# Patient Record
Sex: Female | Born: 1965
Health system: Southern US, Community
[De-identification: ages and names within clinical notes are randomized; demographics above are authoritative.]

## PROBLEM LIST (undated history)

## (undated) DIAGNOSIS — J45909 Unspecified asthma, uncomplicated: Secondary | ICD-10-CM

## (undated) DIAGNOSIS — E785 Hyperlipidemia, unspecified: Secondary | ICD-10-CM

## (undated) DIAGNOSIS — G4730 Sleep apnea, unspecified: Secondary | ICD-10-CM

## (undated) DIAGNOSIS — D649 Anemia, unspecified: Secondary | ICD-10-CM

## (undated) DIAGNOSIS — I10 Essential (primary) hypertension: Secondary | ICD-10-CM

## (undated) HISTORY — DX: Essential (primary) hypertension: I10

## (undated) HISTORY — DX: Unspecified asthma, uncomplicated: J45.909

## (undated) HISTORY — PX: WISDOM TOOTH EXTRACTION: SHX21

---

## 1997-10-17 ENCOUNTER — Encounter: Admission: RE | Admit: 1997-10-17 | Discharge: 1997-10-17 | Payer: Self-pay | Admitting: Family Medicine

## 1997-11-01 ENCOUNTER — Encounter: Admission: RE | Admit: 1997-11-01 | Discharge: 1997-11-01 | Payer: Self-pay | Admitting: Sports Medicine

## 1998-01-19 ENCOUNTER — Encounter: Admission: RE | Admit: 1998-01-19 | Discharge: 1998-01-19 | Payer: Self-pay | Admitting: Family Medicine

## 1998-08-08 ENCOUNTER — Encounter: Admission: RE | Admit: 1998-08-08 | Discharge: 1998-08-08 | Payer: Self-pay | Admitting: Family Medicine

## 1998-10-25 ENCOUNTER — Encounter: Admission: RE | Admit: 1998-10-25 | Discharge: 1998-10-25 | Payer: Self-pay | Admitting: Sports Medicine

## 1998-11-01 ENCOUNTER — Encounter: Admission: RE | Admit: 1998-11-01 | Discharge: 1998-11-01 | Payer: Self-pay | Admitting: *Deleted

## 1998-11-15 ENCOUNTER — Encounter: Admission: RE | Admit: 1998-11-15 | Discharge: 1998-11-15 | Payer: Self-pay | Admitting: Family Medicine

## 1998-12-13 ENCOUNTER — Encounter: Admission: RE | Admit: 1998-12-13 | Discharge: 1998-12-13 | Payer: Self-pay | Admitting: Family Medicine

## 1999-02-02 ENCOUNTER — Encounter: Admission: RE | Admit: 1999-02-02 | Discharge: 1999-02-02 | Payer: Self-pay | Admitting: Family Medicine

## 1999-03-09 ENCOUNTER — Encounter: Admission: RE | Admit: 1999-03-09 | Discharge: 1999-03-09 | Payer: Self-pay | Admitting: Family Medicine

## 1999-06-11 ENCOUNTER — Encounter: Admission: RE | Admit: 1999-06-11 | Discharge: 1999-06-11 | Payer: Self-pay | Admitting: Family Medicine

## 1999-11-29 ENCOUNTER — Encounter: Admission: RE | Admit: 1999-11-29 | Discharge: 1999-11-29 | Payer: Self-pay | Admitting: Family Medicine

## 2000-01-01 ENCOUNTER — Encounter: Admission: RE | Admit: 2000-01-01 | Discharge: 2000-01-01 | Payer: Self-pay | Admitting: Sports Medicine

## 2000-02-08 ENCOUNTER — Encounter: Admission: RE | Admit: 2000-02-08 | Discharge: 2000-02-08 | Payer: Self-pay | Admitting: Family Medicine

## 2000-03-12 ENCOUNTER — Encounter: Admission: RE | Admit: 2000-03-12 | Discharge: 2000-03-12 | Payer: Self-pay | Admitting: Family Medicine

## 2000-05-08 ENCOUNTER — Encounter: Admission: RE | Admit: 2000-05-08 | Discharge: 2000-05-08 | Payer: Self-pay | Admitting: Family Medicine

## 2000-06-23 ENCOUNTER — Encounter: Admission: RE | Admit: 2000-06-23 | Discharge: 2000-06-23 | Payer: Self-pay | Admitting: Family Medicine

## 2000-06-27 ENCOUNTER — Encounter: Admission: RE | Admit: 2000-06-27 | Discharge: 2000-06-27 | Payer: Self-pay | Admitting: Family Medicine

## 2000-10-15 ENCOUNTER — Encounter: Admission: RE | Admit: 2000-10-15 | Discharge: 2000-10-15 | Payer: Self-pay | Admitting: Family Medicine

## 2001-05-07 ENCOUNTER — Encounter: Admission: RE | Admit: 2001-05-07 | Discharge: 2001-05-07 | Payer: Self-pay | Admitting: Sports Medicine

## 2001-12-22 ENCOUNTER — Encounter: Admission: RE | Admit: 2001-12-22 | Discharge: 2001-12-22 | Payer: Self-pay | Admitting: Family Medicine

## 2002-01-22 ENCOUNTER — Encounter: Admission: RE | Admit: 2002-01-22 | Discharge: 2002-01-22 | Payer: Self-pay | Admitting: Family Medicine

## 2002-06-24 ENCOUNTER — Encounter: Admission: RE | Admit: 2002-06-24 | Discharge: 2002-06-24 | Payer: Self-pay | Admitting: Family Medicine

## 2002-07-06 ENCOUNTER — Encounter: Payer: Self-pay | Admitting: Sports Medicine

## 2002-07-06 ENCOUNTER — Encounter: Admission: RE | Admit: 2002-07-06 | Discharge: 2002-07-06 | Payer: Self-pay | Admitting: Sports Medicine

## 2002-07-13 ENCOUNTER — Encounter: Admission: RE | Admit: 2002-07-13 | Discharge: 2002-07-13 | Payer: Self-pay | Admitting: Sports Medicine

## 2002-07-22 ENCOUNTER — Encounter: Payer: Self-pay | Admitting: *Deleted

## 2002-07-22 ENCOUNTER — Encounter: Admission: RE | Admit: 2002-07-22 | Discharge: 2002-07-22 | Payer: Self-pay | Admitting: Sports Medicine

## 2002-07-22 LAB — CONVERTED CEMR LAB
Ferritin: 31 ng/mL
Iron: 46 ug/dL
TIBC: 294 ug/dL
Vitamin B-12: 1045 pg/mL

## 2002-09-08 ENCOUNTER — Encounter: Admission: RE | Admit: 2002-09-08 | Discharge: 2002-09-08 | Payer: Self-pay | Admitting: Family Medicine

## 2002-09-13 ENCOUNTER — Encounter: Admission: RE | Admit: 2002-09-13 | Discharge: 2002-09-13 | Payer: Self-pay | Admitting: Sports Medicine

## 2002-12-21 ENCOUNTER — Encounter: Admission: RE | Admit: 2002-12-21 | Discharge: 2002-12-21 | Payer: Self-pay | Admitting: Sports Medicine

## 2002-12-28 ENCOUNTER — Encounter: Admission: RE | Admit: 2002-12-28 | Discharge: 2002-12-28 | Payer: Self-pay | Admitting: Sports Medicine

## 2003-01-07 ENCOUNTER — Encounter: Admission: RE | Admit: 2003-01-07 | Discharge: 2003-01-07 | Payer: Self-pay | Admitting: Family Medicine

## 2003-01-28 ENCOUNTER — Encounter: Admission: RE | Admit: 2003-01-28 | Discharge: 2003-01-28 | Payer: Self-pay | Admitting: Family Medicine

## 2003-02-28 ENCOUNTER — Encounter: Admission: RE | Admit: 2003-02-28 | Discharge: 2003-02-28 | Payer: Self-pay | Admitting: Sports Medicine

## 2003-04-19 ENCOUNTER — Encounter: Admission: RE | Admit: 2003-04-19 | Discharge: 2003-04-19 | Payer: Self-pay | Admitting: Family Medicine

## 2003-04-30 ENCOUNTER — Ambulatory Visit (HOSPITAL_BASED_OUTPATIENT_CLINIC_OR_DEPARTMENT_OTHER): Admission: RE | Admit: 2003-04-30 | Discharge: 2003-04-30 | Payer: Self-pay

## 2003-05-23 ENCOUNTER — Encounter: Admission: RE | Admit: 2003-05-23 | Discharge: 2003-05-23 | Payer: Self-pay | Admitting: Family Medicine

## 2003-08-01 ENCOUNTER — Encounter: Admission: RE | Admit: 2003-08-01 | Discharge: 2003-08-01 | Payer: Self-pay | Admitting: Sports Medicine

## 2004-05-07 ENCOUNTER — Ambulatory Visit: Payer: Self-pay | Admitting: Family Medicine

## 2004-05-16 ENCOUNTER — Ambulatory Visit: Payer: Self-pay | Admitting: Family Medicine

## 2004-07-19 ENCOUNTER — Encounter (INDEPENDENT_AMBULATORY_CARE_PROVIDER_SITE_OTHER): Payer: Self-pay | Admitting: *Deleted

## 2004-08-06 ENCOUNTER — Encounter: Payer: Self-pay | Admitting: *Deleted

## 2004-08-06 ENCOUNTER — Ambulatory Visit: Payer: Self-pay | Admitting: Family Medicine

## 2004-08-06 LAB — CONVERTED CEMR LAB
HCT: 36.1 %
Hemoglobin: 12 g/dL
MCV: 93.3 fL
Platelets: 289 10*3/uL

## 2004-09-26 ENCOUNTER — Ambulatory Visit: Payer: Self-pay | Admitting: Family Medicine

## 2004-11-20 ENCOUNTER — Ambulatory Visit: Payer: Self-pay | Admitting: Sports Medicine

## 2005-02-04 ENCOUNTER — Ambulatory Visit: Payer: Self-pay | Admitting: Family Medicine

## 2005-03-04 ENCOUNTER — Encounter: Admission: RE | Admit: 2005-03-04 | Discharge: 2005-03-04 | Payer: Self-pay | Admitting: Sports Medicine

## 2005-10-23 ENCOUNTER — Encounter: Payer: Self-pay | Admitting: *Deleted

## 2005-10-23 ENCOUNTER — Ambulatory Visit: Payer: Self-pay | Admitting: Family Medicine

## 2005-10-23 LAB — CONVERTED CEMR LAB
HCT: 34.3 %
WBC: 5 10*3/uL

## 2005-12-05 ENCOUNTER — Ambulatory Visit: Payer: Self-pay | Admitting: Family Medicine

## 2005-12-25 ENCOUNTER — Ambulatory Visit: Payer: Self-pay | Admitting: Family Medicine

## 2006-03-04 ENCOUNTER — Encounter: Admission: RE | Admit: 2006-03-04 | Discharge: 2006-03-04 | Payer: Self-pay | Admitting: Sports Medicine

## 2006-03-04 ENCOUNTER — Ambulatory Visit: Payer: Self-pay | Admitting: Family Medicine

## 2006-04-17 DIAGNOSIS — E785 Hyperlipidemia, unspecified: Secondary | ICD-10-CM

## 2006-04-17 DIAGNOSIS — J309 Allergic rhinitis, unspecified: Secondary | ICD-10-CM | POA: Insufficient documentation

## 2006-04-17 DIAGNOSIS — G4730 Sleep apnea, unspecified: Secondary | ICD-10-CM | POA: Insufficient documentation

## 2006-04-17 DIAGNOSIS — L659 Nonscarring hair loss, unspecified: Secondary | ICD-10-CM | POA: Insufficient documentation

## 2006-04-17 DIAGNOSIS — J45909 Unspecified asthma, uncomplicated: Secondary | ICD-10-CM | POA: Insufficient documentation

## 2006-04-18 ENCOUNTER — Encounter (INDEPENDENT_AMBULATORY_CARE_PROVIDER_SITE_OTHER): Payer: Self-pay | Admitting: *Deleted

## 2007-01-06 ENCOUNTER — Encounter: Payer: Self-pay | Admitting: Family Medicine

## 2009-05-30 ENCOUNTER — Ambulatory Visit: Payer: Self-pay | Admitting: Family Medicine

## 2009-05-30 ENCOUNTER — Encounter: Payer: Self-pay | Admitting: Family Medicine

## 2009-05-30 DIAGNOSIS — R0989 Other specified symptoms and signs involving the circulatory and respiratory systems: Secondary | ICD-10-CM

## 2009-05-30 DIAGNOSIS — N920 Excessive and frequent menstruation with regular cycle: Secondary | ICD-10-CM

## 2009-05-30 DIAGNOSIS — R030 Elevated blood-pressure reading, without diagnosis of hypertension: Secondary | ICD-10-CM

## 2009-05-30 DIAGNOSIS — M25569 Pain in unspecified knee: Secondary | ICD-10-CM

## 2009-05-30 DIAGNOSIS — E669 Obesity, unspecified: Secondary | ICD-10-CM | POA: Insufficient documentation

## 2009-05-30 DIAGNOSIS — R0609 Other forms of dyspnea: Secondary | ICD-10-CM

## 2009-05-30 LAB — CONVERTED CEMR LAB
AST: 18 units/L (ref 0–37)
Albumin: 4.2 g/dL (ref 3.5–5.2)
Alkaline Phosphatase: 52 units/L (ref 39–117)
Calcium: 9.3 mg/dL (ref 8.4–10.5)
HCT: 30.8 % — ABNORMAL LOW (ref 36.0–46.0)
Hemoglobin: 9.6 g/dL — ABNORMAL LOW (ref 12.0–15.0)
MCHC: 31.2 g/dL (ref 30.0–36.0)
MCV: 87.7 fL (ref 78.0–100.0)
Platelets: 311 10*3/uL (ref 150–400)
RDW: 14.1 % (ref 11.5–15.5)
Sodium: 137 meq/L (ref 135–145)
Total Protein: 7.1 g/dL (ref 6.0–8.3)
Triglycerides: 74 mg/dL (ref <150)

## 2009-05-31 ENCOUNTER — Encounter: Payer: Self-pay | Admitting: Family Medicine

## 2009-05-31 DIAGNOSIS — D649 Anemia, unspecified: Secondary | ICD-10-CM

## 2009-06-02 LAB — CONVERTED CEMR LAB
Ferritin: 9 ng/mL — ABNORMAL LOW (ref 10–291)
Saturation Ratios: 43 % (ref 20–55)
TIBC: 431 ug/dL (ref 250–470)
Vitamin B-12: 903 pg/mL (ref 211–911)

## 2009-06-13 ENCOUNTER — Telehealth: Payer: Self-pay | Admitting: *Deleted

## 2009-07-25 ENCOUNTER — Ambulatory Visit: Payer: Self-pay | Admitting: Family Medicine

## 2009-07-25 ENCOUNTER — Encounter: Payer: Self-pay | Admitting: Family Medicine

## 2009-07-26 LAB — CONVERTED CEMR LAB
HCT: 33 % — ABNORMAL LOW (ref 36.0–46.0)
Hemoglobin: 10.6 g/dL — ABNORMAL LOW (ref 12.0–15.0)
MCHC: 32.1 g/dL (ref 30.0–36.0)
MCV: 90.7 fL (ref 78.0–100.0)
Platelets: 260 10*3/uL (ref 150–400)
Retic Ct Pct: 1.7 % (ref 0.4–3.1)
WBC: 5.2 10*3/uL (ref 4.0–10.5)

## 2009-08-17 ENCOUNTER — Telehealth: Payer: Self-pay | Admitting: *Deleted

## 2009-11-07 ENCOUNTER — Ambulatory Visit: Payer: Self-pay | Admitting: Family Medicine

## 2009-11-07 ENCOUNTER — Encounter: Payer: Self-pay | Admitting: Family Medicine

## 2009-11-07 DIAGNOSIS — N898 Other specified noninflammatory disorders of vagina: Secondary | ICD-10-CM | POA: Insufficient documentation

## 2009-11-07 DIAGNOSIS — R198 Other specified symptoms and signs involving the digestive system and abdomen: Secondary | ICD-10-CM | POA: Insufficient documentation

## 2009-11-07 LAB — CONVERTED CEMR LAB: Pap Smear: NEGATIVE

## 2009-11-11 ENCOUNTER — Encounter: Payer: Self-pay | Admitting: Family Medicine

## 2009-11-11 LAB — CONVERTED CEMR LAB
GC Probe Amp, Genital: NEGATIVE
HCT: 30.1 % — ABNORMAL LOW (ref 36.0–46.0)
MCHC: 32.9 g/dL (ref 30.0–36.0)
MCV: 93.5 fL (ref 78.0–100.0)
WBC: 4.3 10*3/uL (ref 4.0–10.5)

## 2010-03-21 NOTE — Assessment & Plan Note (Signed)
Summary: allergies,df   Vital Signs:  Patient profile:   45 year old female Height:      63 inches Weight:      220.6 pounds BMI:     39.22 Temp:     98.6 degrees F oral Pulse rate:   83 / minute BP sitting:   141 / 92  (left arm) Cuff size:   regular  Vitals Entered By: Garen Grams LPN (July 25, 1608 11:07 AM) CC: f/u allergies/asthma, f/u BP, anemia Is Patient Diabetic? No Pain Assessment Patient in pain? no        CC:  f/u allergies/asthma, f/u BP, and anemia.  History of Present Illness: allergies/asthma: has been taking zyrtec but doesn't think this is helping.  finds herself feeling like she is wheezing and coughing partiucarly at night but also during the day.  it has worsened as the summer has come on.  she never got the nose spray for her allergies due to cost.  she thinks she needs an inhaler.  she denies fevers.   blood pressure: has noted that it has been up also outside of clinic though didn't bring numbers to clinic.  has noticed some headaches but unsure if related to blood pressure.  denies dizziness, denies weakness, denies swelling in her legs.  isn't using her cpap machine and hasn't in >61yr.   anemia: has been taking her iron pills without problems.  denies constipation  Habits & Providers  Alcohol-Tobacco-Diet     Tobacco Status: never  Current Medications (verified): 1)  Daily Vitamins For Women  Tabs (Multiple Vitamins-Calcium) .... One Daily 2)  Naproxen 500 Mg Tabs (Naproxen) .Marland Kitchen.. 1 By Mouth Two Times A Day As Needed Knee Pain 3)  Fluticasone Propionate 50 Mcg/act Susp (Fluticasone Propionate) .... 2 Sprays Each Nostril Two Times A Day For Congestion 4)  Ferrous Sulfate 325 (65 Fe) Mg Tabs (Ferrous Sulfate) .... Once Daily Otc Supplement 5)  Proventil Hfa 108 (90 Base) Mcg/act Aers (Albuterol Sulfate) .... 2 Puffs Q4hr As Needed Wheezing/shortness of Breath 6)  Symbicort 80-4.5 Mcg/act Aero (Budesonide-Formoterol Fumarate) .... 2 Puffs Inhaled Two  Times A Day For Asthma Maintenance 7)  Hydrochlorothiazide 12.5 Mg Caps (Hydrochlorothiazide) .Marland Kitchen.. 1 By Mouth Once Daily For Blood Pressure Control  Allergies (verified): No Known Drug Allergies  Past History:  Past medical, surgical, family and social histories (including risk factors) reviewed for relevance to current acute and chronic problems.  Past Medical History: Reviewed history from 06/02/2009 and no changes required. ? asthma - no history of intubations for asthma - allergic rhinitis obesity heavy menses knee pain moderate OSA - supposed to be on CPAP w/ settings 10cwp, rdi 0.5.  sleep study 04/30/03.   Alopecia keloids s/p 4th degree perineal lac 03/1996 [id number] (SAB at 77mo, NVSV x2)  Past Surgical History: Reviewed history from 05/30/2009 and no changes required. none  Family History: Reviewed history from 06/02/2009 and no changes required. mother : deceased.  h/o HTN.  dad: living - prostate problems. history of asthma on father's side of family siblings healthy 2 children : healthy  Social History: Reviewed history from 06/02/2009 and no changes required. lives with children (edem, anne born in 18, 18) and husband (tontonvi). originally from [place] - [place] since 1994.  english is 2nd language.  no tobacco, alcohol, drugs.  finished school in [place].  stays at home with kids.   Review of Systems       per HPI  Physical  Exam  General:  Well-developed,well-nourished,in no acute distress; alert,appropriate and cooperative throughout examination. Obese. vital signs noted Lungs:  Normal respiratory effort, chest expands symmetrically. Lungs are clear to auscultation, no crackles or wheezes. Heart:  Normal rate and regular rhythm. S1 and S2 normal without gallop, murmur, click, rub or other extra sounds. Neurologic:  alert & oriented X3.  no gross neurologic deficits noted.  gait normal.  cn 2-12 grossly intact   Impression &  Recommendations:  Problem # 1:  ELEVATED BLOOD PRESSURE WITHOUT DIAGNOSIS OF HYPERTENSION (ICD-796.2) Assessment Unchanged  will start HCTZ today in low dose as she is close to goal.  f/u 1 month and recheck labs to make sure kidney fxn, electrolytes okay and to see how blood pressure readings are.  see if headaches improve with helping blood pressure.  likely will need to get back on cpap as well to help blood pressure  Her updated medication list for this problem includes:    Hydrochlorothiazide 12.5 Mg Caps (Hydrochlorothiazide) .Marland Kitchen... 1 by mouth once daily for blood pressure control  Orders: [facility]- Est  Level 4 ([address])  Problem # 2:  ? of ASTHMA (ICD-493.90) Assessment: Deteriorated  still unclear if allergies, asthma or both.  continue zyrtec.  cannot afford nose spray.  add controller med for asthma since she feels like she would use rescue inhaler at least daily and add rescue inhaler as well.   f/u 1 month to see if she feels breathing is improved   Her updated medication list for this problem includes:    Proventil Hfa 108 (90 Base) Mcg/act Aers (Albuterol sulfate) .Marland Kitchen... 2 puffs q4hr as needed wheezing/shortness of breath    Symbicort 80-4.5 Mcg/act Aero (Budesonide-formoterol fumarate) .Marland Kitchen... 2 puffs inhaled two times a day for asthma maintenance  Orders: [facility]- Est  Level 4 ([address])  Problem # 3:  ANEMIA (ICD-285.9) Assessment: Unchanged repeat blood work today to see if improved.  continue iron therapy.  source of blood loss at this time appears to be menses.  Her updated medication list for this problem includes:    Ferrous Sulfate 325 (65 Fe) Mg Tabs (Ferrous sulfate) ..... Once daily otc supplement  Orders: CBC-[facility] ([address]) Retic-[facility] ([id number]) Ferritin-[facility] [phone]) Iron -[facility] [phone]) Iron Binding Cap (TIBC)-[facility] ([id number]) [facility]- Est  Level 4 ([address])  Complete Medication List: 1)  Daily Vitamins For Women Tabs (Multiple vitamins-calcium) .... One daily 2)   Naproxen 500 Mg Tabs (Naproxen) .Marland Kitchen.. 1 by mouth two times a day as needed knee pain 3)  Fluticasone Propionate 50 Mcg/act Susp (Fluticasone propionate) .... 2 sprays each nostril two times a day for congestion 4)  Ferrous Sulfate 325 (65 Fe) Mg Tabs (Ferrous sulfate) .... Once daily otc supplement 5)  Proventil Hfa 108 (90 Base) Mcg/act Aers (Albuterol sulfate) .... 2 puffs q4hr as needed wheezing/shortness of breath 6)  Symbicort 80-4.5 Mcg/act Aero (Budesonide-formoterol fumarate) .... 2 puffs inhaled two times a day for asthma maintenance 7)  Hydrochlorothiazide 12.5 Mg Caps (Hydrochlorothiazide) .Marland Kitchen.. 1 by mouth once daily for blood pressure control  Patient Instructions: 1)  please follow up in 1 month for your breathing/allergies as well as your headaches and blood pressure. 2)  I have sent over 3 prescriptions - one for your blood pressure, 2 for your asthma (the 2 cards for discounts work for your asthma medicines) Prescriptions: PROVENTIL HFA 108 (90 BASE) MCG/ACT AERS (ALBUTEROL SULFATE) 2 puffs q4hr as needed wheezing/shortness of breath  #1 x 6   Entered and Authorized by:  Ancil Boozer  MD   Signed by:   Ancil Boozer  MD on 07/26/2009   Method used:   Print then Give to Patient   RxID:   [id number] HYDROCHLOROTHIAZIDE 12.5 MG CAPS (HYDROCHLOROTHIAZIDE) 1 by mouth once daily for blood pressure control  #30 x 6   Entered and Authorized by:   Ancil Boozer  MD   Signed by:   Ancil Boozer  MD on 07/26/2009   Method used:   Print then Give to Patient   RxID:   [id number] SYMBICORT 80-4.5 MCG/ACT AERO (BUDESONIDE-FORMOTEROL FUMARATE) 2 puffs inhaled two times a day for asthma maintenance  #1 x 6   Entered and Authorized by:   Ancil Boozer  MD   Signed by:   Ancil Boozer  MD on 07/26/2009   Method used:   Print then Give to Patient   RxID:   [id number]

## 2010-03-21 NOTE — Progress Notes (Signed)
Summary: Rx   Phone Note Outgoing Call Call back at [phone]   Call placed by: Garen Grams LPN,  June 13, 2009 10:55 AM Call placed to: Patient Summary of Call: Patient called and [facility] [address] [facility] Rx resent, patient informed. Initial call taken by: Garen Grams LPN,  June 13, 2009 10:56 AM    Prescriptions: FLUTICASONE PROPIONATE 50 MCG/ACT SUSP (FLUTICASONE PROPIONATE) 2 sprays each nostril two times a day for congestion  #1 x 6   Entered by:   Garen Grams LPN   Authorized by:   Ancil Boozer  MD   Signed by:   Garen Grams LPN on 45/40/9811   Method used:   [facility]        [facility] [phone]* (retail)       [address]       [place], [place]  [address]       Ph: [phone] or [phone]       Fax: [phone]   RxID:   [id number]

## 2010-03-21 NOTE — Assessment & Plan Note (Signed)
Summary: cpp/eo   Vital Signs:  Patient profile:   45 year old female Height:      63 inches Weight:      218.2 pounds BMI:     38.79 Temp:     98.4 degrees F oral Pulse rate:   85 / minute BP sitting:   136 / 84  (left arm) Cuff size:   regular  Vitals Entered By: Garen Grams LPN (November 07, 2009 9:13 AM) CC: cpp Is Patient Diabetic? No Pain Assessment Patient in pain? no        Primary Care Klinton Candelas:  Alvia Grove DO  CC:  cpp.  History of Present Illness: 45 yo female here for concern of heavy periods. Typically, pervious periods came about every 28 days and  usually lasted about 3 days, described as mild - moderate flow, changing tampons only 2--3  times daily.    The past 3-4  months periods have increased in duration to  6-7 days and are very heavy.  Pt reports having to use about 5-6 tampons daily.  Notes clots in blood and is now having cramps and abd pain  with her periods the past 3  months.  Pt also concerned about STD exposure and is requesting testing. Unwilling to discuss why she thinks she was exposed.  Was on iron pills due to low hgb.  Completed course and not taking currently.  denies dizziness, no SOB, no chest pain.     Habits & Providers  Alcohol-Tobacco-Diet     Alcohol drinks/day: 0     Tobacco Status: never     Tobacco Counseling: not indicated; no tobacco use  Exercise-Depression-Behavior     STD Risk: past  Current Problems (verified): 1)  Vaginal Discharge  (ICD-623.5) 2)  Other Symptoms Involving Abdomen and Pelvis  (ICD-789.9) 3)  Contact or Exposure To Other Viral Diseases  (ICD-V01.79) 4)  Contact With or Exposure To Venereal Diseases  (ICD-V01.6) 5)  Special Screening For Other Malignant Neoplasm  (ICD-V76.89) 6)  Anemia  (ICD-285.9) 7)  Excessive or Frequent Menstruation  (ICD-626.2) 8)  Knee Pain, Bilateral  (ICD-719.46) 9)  Snoring  (ICD-786.09) 10)  ? of Asthma  (ICD-493.90) 11)  Elevated Blood Pressure Without  Diagnosis of Hypertension  (ICD-796.2) 12)  Obesity, Unspecified  (ICD-278.00)  Current Medications (verified): 1)  Daily Vitamins For Women  Tabs (Multiple Vitamins-Calcium) .... One Daily 2)  Naproxen 500 Mg Tabs (Naproxen) .Marland Kitchen.. 1 By Mouth Two Times A Day As Needed Knee Pain 3)  Fluticasone Propionate 50 Mcg/act Susp (Fluticasone Propionate) .... 2 Sprays Each Nostril Two Times A Day For Congestion 4)  Ferrous Sulfate 325 (65 Fe) Mg Tabs (Ferrous Sulfate) .... Once Daily Otc Supplement 5)  Proventil Hfa 108 (90 Base) Mcg/act Aers (Albuterol Sulfate) .... 2 Puffs Q4hr As Needed Wheezing/shortness of Breath 6)  Symbicort 80-4.5 Mcg/act Aero (Budesonide-Formoterol Fumarate) .... 2 Puffs Inhaled Two Times A Day For Asthma Maintenance 7)  Hydrochlorothiazide 12.5 Mg Caps (Hydrochlorothiazide) .Marland Kitchen.. 1 By Mouth Once Daily For Blood Pressure Control  Allergies (verified): No Known Drug Allergies  Past History:  Past Medical History: Last updated: 06/02/2009 ? asthma - no history of intubations for asthma - allergic rhinitis obesity heavy menses knee pain moderate OSA - supposed to be on CPAP w/ settings 10cwp, rdi 0.5.  sleep study 04/30/03.   Alopecia keloids s/p 4th degree perineal lac 03/1996 [id number] (SAB at 63mo, NVSV x2)  Past Surgical History: Last updated: 05/30/2009 none  Family History:  Last updated: 06/02/2009 mother : deceased.  h/o HTN.  dad: living - prostate problems. history of asthma on father's side of family siblings healthy 2 children : healthy  Social History: Last updated: 06/02/2009 lives with children (edem, anne born in 1985, 39) and husband (tontonvi). originally from [place] - in [place] since 1994.  english is 2nd language.  no tobacco, alcohol, drugs.  finished school in [place].  stays at home with kids.   Risk Factors: Alcohol Use: 0 (11/07/2009)  Risk Factors: Smoking Status: never (11/07/2009)  Family History: Reviewed history from  06/02/2009 and no changes required. mother : deceased.  h/o HTN.  dad: living - prostate problems. history of asthma on father's side of family siblings healthy 2 children : healthy  Social History: Reviewed history from 06/02/2009 and no changes required. lives with children (edem, anne born in 89, 50) and husband (tontonvi). originally from [place] - in [place].  english is 2nd language.  no tobacco, alcohol, drugs.  finished school in [place].  stays at home with kids. STD Risk:  past  Review of Systems       The patient complains of abdominal pain and abnormal bleeding.  The patient denies fever, chest pain, syncope, genital sores, depression, unusual weight change, and breast masses.    Physical Exam  General:  vs reviewed, alert, well-developed, well-nourished, and well-hydrated.   Neck:  supple, no masses, no thyromegaly, and no thyroid nodules or tenderness.   Breasts:  No mass, nodules, thickening, tenderness, bulging, retraction, inflamation, nipple discharge or skin changes noted.   Lungs:  Normal respiratory effort, chest expands symmetrically. Lungs are clear to auscultation, no crackles or wheezes. Heart:  Normal rate and regular rhythm. S1 and S2 normal without gallop, murmur, click, rub or other extra sounds. Abdomen:  Bowel sounds positive,abdomen soft and non-tender without masses, organomegaly or hernias noted.  Pelvic Exam  Vulva:      normal appearance, normal hair distribution, no lesions or masses.   Urethra and Bladder:      Urethra--normal, no masses, non-tender, no discharge.  Bladder--normal, no masses, non-tender, non-distended.   Vagina:      normal, ruggated, physiologic discharge, no lesions, no masses, no cystocele, adequate pelvic support.   Cervix:      normal, midposition, no CMT, no lesions.   Uterus:      smooth, anteverted, anteflexed, mobile, non-tender, firm, adequate support, no prolapse.   Adnexa:      normal, no masses  bilaterally, mobile bilaterally, nontender bilaterally.   Rectum:      normal, no masses, rectovaginal septum intact without nodularity, anal sphincter intact, heme negative stool.    Pelvimety:       Ischial Spines:  average      Sacrum:  concave      Subpubic Arch:  normal      Pelvis type:  gynecoid   Impression & Recommendations:  Problem # 1:  ANEMIA (ICD-285.9) check cbc today Her updated medication list for this problem includes:    Ferrous Sulfate 325 (65 Fe) Mg Tabs (Ferrous sulfate) ..... Once daily otc supplement  Orders: CBC-[facility] (04540) [facility]- Est  Level 4 (98119)  Problem # 2:  EXCESSIVE OR FREQUENT MENSTRUATION (ICD-626.2) Unclear etiology.  Pap today for screening Orders: CBC-[facility] (14782)  Problem # 3:  OTHER SYMPTOMS INVOLVING ABDOMEN AND PELVIS (ICD-789.9) check GC/chlamydia.  Discussed safe sex and encouraged condoms Orders: GC/Chlamydia-[facility] (87591/87491) [facility]- Est  Level 4 (95621)  Problem # 4:  CONTACT OR EXPOSURE TO OTHER VIRAL DISEASES (ICD-V01.79) as #3 Orders: RPR-[facility] ([id number])  Problem # 5:  CONTACT WITH OR EXPOSURE TO VENEREAL DISEASES (ICD-V01.6)  Complete Medication List: 1)  Daily Vitamins For Women Tabs (Multiple vitamins-calcium) .... One daily 2)  Naproxen 500 Mg Tabs (Naproxen) .Marland Kitchen.. 1 by mouth two times a day as needed knee pain 3)  Fluticasone Propionate 50 Mcg/act Susp (Fluticasone propionate) .... 2 sprays each nostril two times a day for congestion 4)  Ferrous Sulfate 325 (65 Fe) Mg Tabs (Ferrous sulfate) .... Once daily otc supplement 5)  Proventil Hfa 108 (90 Base) Mcg/act Aers (Albuterol sulfate) .... 2 puffs q4hr as needed wheezing/shortness of breath 6)  Symbicort 80-4.5 Mcg/act Aero (Budesonide-formoterol fumarate) .... 2 puffs inhaled two times a day for asthma maintenance 7)  Hydrochlorothiazide 12.5 Mg Caps (Hydrochlorothiazide) .Marland Kitchen.. 1 by mouth once daily for blood pressure control  Other Orders: HIV-[facility]  ([id number]) Pap Smear-[facility] ([id number]) [facility]  Patient Instructions: 1)  Nice to meet you today! 2)  I will send you a letter about your pap smear results and your blood work. 3)  Take Ibuprofen for your cramps.  4)  Follow up in 3 months  Laboratory Results  Date/Time Received: November 07, 2009 10:14 AM  Date/Time Reported: November 07, 2009 10:25 AM   [employer] Source: vaginal WBC/hpf: 0+3 Bacteria/hpf: 3+  Rods Clue cells/hpf: none  Negative whiff Yeast/hpf: none Trichomonas/hpf: none Comments: ...........test performed by...........Marland KitchenTerese Door, CMA

## 2010-03-21 NOTE — Progress Notes (Signed)
Summary: meds prob   Phone Note Call from Patient Call back at [phone]   Caller: Patient Summary of Call: hasn't been able to get her HCTZ and [facility]  Initial call taken by: De Nurse,  August 17, 2009 11:34 AM  Follow-up for Phone Call       Follow-up by: Golden Circle RN,  August 17, 2009 11:36 AM    Prescriptions: HYDROCHLOROTHIAZIDE 12.5 MG CAPS (HYDROCHLOROTHIAZIDE) 1 by mouth once daily for blood pressure control  #30 x 6   Entered by:   Golden Circle RN   Authorized by:   Ancil Boozer  MD   Signed by:   Golden Circle RN on 08/17/2009   Method used:   [facility]        [facility] [phone]* (retail)       [address]       [place], [place]  [address]       Ph: [phone] or [phone]       Fax: [phone]   RxID:   [id number]   Appended Document: meds prob explained to her she just has to call her pharmacy when she is running low & she can get refills. told her I sent it again for her

## 2010-03-21 NOTE — Letter (Signed)
Summary: Generic Letter  [facility]  [address]   [place], [place] [address]   Phone: [phone]  Fax: [phone]    11/11/2009  Regina Crawford [address] [place], [place]  [address]  Dear [name],    I have reviewed your recent labs and your hemeglobin is still low.  [facility].  Please take this medicine twice a day for 8 weeks and then return to see me in the office for a follow up visit.  Please call my office with any questions.       Sincerely,   Alvia Grove DO  Appended Document: Generic Letter mailed.

## 2010-03-21 NOTE — Assessment & Plan Note (Signed)
Summary: NP,tcb   Vital Signs:  Patient profile:   45 year old female Height:      63 inches Weight:      217 pounds BMI:     38.58 Temp:     98.6 degrees F oral Pulse rate:   88 / minute BP sitting:   143 / 90  (left arm) Cuff size:   regular  Vitals Entered By: Garen Grams LPN (May 30, 2009 10:17 AM) CC: New Patient Is Patient Diabetic? No Pain Assessment Patient in pain? no        CC:  New Patient.  History of Present Illness: multiple concerns today:  needs to catch up on screening - pap, mammo, cholesterol, etc.    blood pressure borderline - repeat 144/90  ?asthma: reports snoring but no wheezing.  bed partner states no apnea.  does sometimes wake up tired.    prolonged menses: still regular each month but becoming heavier, lasting longer and now with some clots.  not painful.    knee pain: been there about 3-4 months.  hurts with bending knee.  right is worse than left.  hasn't tired meds to help.  no known injury.  never is swollen or red or warm to touch   Habits & Providers  Alcohol-Tobacco-Diet     Alcohol drinks/day: 0     Tobacco Status: never  Exercise-Depression-Behavior     Drug Use: never  Current Medications (verified): 1)  Daily Vitamins For Women  Tabs (Multiple Vitamins-Calcium) .... One Daily 2)  Naproxen 500 Mg Tabs (Naproxen) .Marland Kitchen.. 1 By Mouth Two Times A Day As Needed Knee Pain 3)  Fluticasone Propionate 50 Mcg/act Susp (Fluticasone Propionate) .... 2 Sprays Each Nostril Two Times A Day For Congestion  Allergies (verified): No Known Drug Allergies  Past History:  Past medical, surgical, family and social histories (including risk factors) reviewed, and no changes noted (except as noted below).  Past Medical History: ? asthma - no history of intubations for asthma - (I think perhaps there is a language barrier and that she means allergic rhinitis and snoring) obesity heavy menses knee pain  Past Surgical  History: none  Family History: Reviewed history and no changes required. mother : deceased.  h/o HTN.  dad: living - prostate problems. siblings healthy 2 children : healthy  Social History: Reviewed history and no changes required. lives with children (edem, anne) and husband (tontonvi). originally from [place]  english is 2nd language.  no tobacco, alcohol, drugs.  finished school in [place].  stays at home with kids. Smoking Status:  never Drug Use:  never  Review of Systems       per HPI  Physical Exam  General:  Well-developed,well-nourished,in no acute distress; alert,appropriate and cooperative throughout examination. Obese. vital signs noted Head:  NCAT Eyes:  pupils equal, pupils round, and pupils reactive to light.   Ears:  External ear exam shows no significant lesions or deformities.  Otoscopic examination reveals clear canals, tympanic membranes are intact bilaterally without bulging, retraction, inflammation or discharge. Hearing is grossly normal bilaterally. Nose:  External nasal examination shows no deformity or inflammation. Nasal mucosa are pink and moist but bilaterally turbinates are slightly boggy.  Mouth:  Oral mucosa and oropharynx without lesions or exudates.  Teeth in good repair. Neck:  No deformities, masses, or tenderness noted. Lungs:  Normal respiratory effort, chest expands symmetrically. Lungs are clear to auscultation, no crackles or wheezes. Heart:  Normal rate and regular rhythm.  S1 and S2 normal without gallop, murmur, click, rub or other extra sounds. Abdomen:  Bowel sounds positive,abdomen soft and non-tender without masses, organomegaly or hernias noted. Msk:  bilateral knees with some tenderness over medial and lateral joint lines.  no effusion.  stable knee.  Extremities:  no edema Neurologic:  alert & oriented X3.   Skin:  acanthosis nigricans noted on neck   Impression & Recommendations:  Problem # 1:  ELEVATED BLOOD  PRESSURE WITHOUT DIAGNOSIS OF HYPERTENSION (ICD-796.2) Assessment New monitor at home.  may need antihypertensive check screening bloodwork today to make sure no cocomittant disorder  Orders: Comp Met-[facility] [phone]) Lipid-[facility] ([id number]) CBC-[facility] (29562) TSH-[facility] ([id number])  Problem # 2:  EXCESSIVE OR FREQUENT MENSTRUATION (ICD-626.2) Assessment: New monitor over time.  if painful or worsening then further workup.    Problem # 3:  KNEE PAIN, BILATERAL (ICD-719.46) Assessment: New suspect likely OA.  trial of naproxen as needed. encouraged wt loss - patient is going to [employer].  monitor   Her updated medication list for this problem includes:    Naproxen 500 Mg Tabs (Naproxen) .Marland Kitchen... 1 by mouth two times a day as needed knee pain  Problem # 4:  ? of ASTHMA (ICD-493.90) Assessment: New suspect likely she is having snoring 2/2 congestion in nose - trial of flonase nasal spray to see if opens her up and helps her congestion  The following medications were removed from the medication list:    Singulair 10 Mg Tabs (Montelukast sodium) .Marland Kitchen... Take one tablet by mouth at bedtime as directed  Complete Medication List: 1)  Daily Vitamins For Women Tabs (Multiple vitamins-calcium) .... One daily 2)  Naproxen 500 Mg Tabs (Naproxen) .Marland Kitchen.. 1 by mouth two times a day as needed knee pain 3)  Fluticasone Propionate 50 Mcg/act Susp (Fluticasone propionate) .... 2 sprays each nostril two times a day for congestion  Patient Instructions: 1)  Please watch your blood pressure at home and follow up with me in about 3 months to recheck how it is doing.  2)  Try the nose spray - it should help with congestion and snoring.   3)  Monitor your menstrual cycles - if they get painful or irregular or otherwise worrisome please feel free to call to schedule an appointment. 4)  Try taking the naproxen about 30 minutes prior to exercise or if you are having pain in your knees. 5)  It was nice to meet  you today. Prescriptions: FLUTICASONE PROPIONATE 50 MCG/ACT SUSP (FLUTICASONE PROPIONATE) 2 sprays each nostril two times a day for congestion  #1 x 6   Entered and Authorized by:   Ancil Boozer  MD   Signed by:   Ancil Boozer  MD on 05/30/2009   Method used:   Faxed to ...       [facility] Department (retail)       [address] [place], [place]  [address]       Ph: [phone]       Fax: [phone]   RxID:   [id number] NAPROXEN 500 MG TABS (NAPROXEN) 1 by mouth two times a day as needed knee pain  #60 x 3   Entered and Authorized by:   Ancil Boozer  MD   Signed by:   Ancil Boozer  MD on 05/30/2009   Method used:   Faxed to ...       [facility] Department (retail)  [address] [place], [place]  [address]       Ph: [phone]       Fax: [phone]   RxID:   [id number]   Appended Document: Orders Update     Clinical Lists Changes  Problems: Added new problem of ANEMIA (ICD-285.9) Orders: Added new Test order of Iron Binding Cap (TIBC)-[facility] ([id number]) - Signed Added new Test order of B12-[facility] [phone]) - Signed Added new Test order of Iron -[facility] [phone]) - Signed Added new Test order of [facility] [phone]) - Signed Added new Test order of Ferritin-[facility] [phone]) - Signed

## 2011-09-20 ENCOUNTER — Encounter: Payer: Self-pay | Admitting: Family Medicine

## 2013-01-19 ENCOUNTER — Ambulatory Visit: Payer: No Typology Code available for payment source | Attending: Internal Medicine

## 2013-02-16 ENCOUNTER — Ambulatory Visit: Payer: No Typology Code available for payment source | Attending: Internal Medicine | Admitting: Internal Medicine

## 2013-02-16 ENCOUNTER — Encounter: Payer: Self-pay | Admitting: Internal Medicine

## 2013-02-16 VITALS — BP 168/108 | HR 93 | Temp 98.2°F | Resp 17 | Wt 222.8 lb

## 2013-02-16 DIAGNOSIS — N920 Excessive and frequent menstruation with regular cycle: Secondary | ICD-10-CM

## 2013-02-16 DIAGNOSIS — J309 Allergic rhinitis, unspecified: Secondary | ICD-10-CM

## 2013-02-16 DIAGNOSIS — Z139 Encounter for screening, unspecified: Secondary | ICD-10-CM

## 2013-02-16 DIAGNOSIS — J45909 Unspecified asthma, uncomplicated: Secondary | ICD-10-CM

## 2013-02-16 DIAGNOSIS — I10 Essential (primary) hypertension: Secondary | ICD-10-CM | POA: Insufficient documentation

## 2013-02-16 DIAGNOSIS — D649 Anemia, unspecified: Secondary | ICD-10-CM

## 2013-02-16 LAB — COMPLETE METABOLIC PANEL WITH GFR
AST: 23 U/L (ref 0–37)
Albumin: 4 g/dL (ref 3.5–5.2)
BUN: 14 mg/dL (ref 6–23)
GFR, Est African American: >89 mL/min
GFR, Est Non African American: >89 mL/min
Glucose, Bld: 86 mg/dL (ref 70–99)
Sodium: 138 mEq/L (ref 135–145)
Total Protein: 6.8 g/dL (ref 6.0–8.3)

## 2013-02-16 LAB — CBC WITH DIFFERENTIAL/PLATELET
Basophils Relative: 1 % (ref 0–1)
Eosinophils Absolute: 0.2 10*3/uL (ref 0.0–0.7)
Eosinophils Relative: 4 % (ref 0–5)
HCT: 31.6 % — ABNORMAL LOW (ref 36.0–46.0)
Hemoglobin: 10.4 g/dL — ABNORMAL LOW (ref 12.0–15.0)
Lymphocytes Relative: 40 % (ref 12–46)
MCH: 30.1 pg (ref 26.0–34.0)
MCHC: 32.9 g/dL (ref 30.0–36.0)
Monocytes Absolute: 0.3 10*3/uL (ref 0.1–1.0)
Platelets: 352 10*3/uL (ref 150–400)
RDW: 14.2 % (ref 11.5–15.5)
WBC: 4.3 10*3/uL (ref 4.0–10.5)

## 2013-02-16 LAB — TSH: TSH: 1.869 u[IU]/mL (ref 0.350–4.500)

## 2013-02-16 MED ORDER — HYDROCHLOROTHIAZIDE 12.5 MG PO CAPS: 12.5000 mg | ORAL_CAPSULE | Freq: Every day | ORAL | Status: DC

## 2013-02-16 MED ORDER — FERROUS SULFATE 325 (65 FE) MG PO TBEC: 325.0000 mg | DELAYED_RELEASE_TABLET | Freq: Two times a day (BID) | ORAL | Status: DC

## 2013-02-16 MED ORDER — ALBUTEROL SULFATE HFA 108 (90 BASE) MCG/ACT IN AERS: 2.0000 | INHALATION_SPRAY | RESPIRATORY_TRACT | Status: DC | PRN

## 2013-02-16 MED ORDER — FLUTICASONE PROPIONATE 50 MCG/ACT NA SUSP: 2.0000 | Freq: Every day | NASAL | Status: DC

## 2013-02-16 MED ORDER — FLUTICASONE FUROATE 27.5 MCG/SPRAY NA SUSP: 1.0000 | Freq: Two times a day (BID) | NASAL | Status: DC

## 2013-02-16 MED ORDER — BUDESONIDE-FORMOTEROL FUMARATE 80-4.5 MCG/ACT IN AERO: 2.0000 | INHALATION_SPRAY | Freq: Two times a day (BID) | RESPIRATORY_TRACT | Status: DC

## 2013-02-16 NOTE — Progress Notes (Signed)
Patient Demographics  Regina Crawford, is a 47 y.o. female  ZOX:[id number]  [id number]  DOB - [birth year]  CC:  Chief Complaint  Patient presents with  . Asthma       HPI: Regina Crawford is a 47 y.o. female here today to establish medical care. She is history of asthma allergic rhinitis anemia hypertension, she has not been taking her blood pressure medication, she is requesting refill on her medications. She reported to have a menstrual periods and feeling tired and fatigued, denies any fever chills. Patient has No headache, No chest pain, No abdominal pain - No Nausea, No new weakness tingling or numbness, No Cough - SOB.  No Known Allergies Past Medical History  Diagnosis Date  . Asthma    Current Outpatient Prescriptions on File Prior to Visit  Medication Sig Dispense Refill  . Multiple Vitamins-Minerals (WOMENS MULTIVITAMIN PLUS) TABS Take 1 tablet by mouth daily.        . naproxen (NAPROSYN) 500 MG tablet Take 500 mg by mouth 2 (two) times daily as needed. Take with food. Use for knee pain        No current facility-administered medications on file prior to visit.   Family History  Problem Relation Age of Onset  . Diabetes Father   . Arthritis Sister   . Arthritis Daughter   . Arthritis Son   . Arthritis Paternal Aunt    History   Social History  . Marital Status: Married    Spouse Name: N/A    Number of Children: N/A  . Years of Education: N/A   Occupational History  . Not on file.   Social History Main Topics  . Smoking status: Not on file  . Smokeless tobacco: Not on file  . Alcohol Use: Yes     Comment: occassional  . Drug Use: No  . Sexual Activity: Not on file   Other Topics Concern  . Not on file   Social History Narrative  . No narrative on file    Review of Systems: Constitutional: Negative for fever, chills, diaphoresis, activity change, appetite change and fatigue. HENT: Negative for ear pain, nosebleeds, congestion, facial swelling,  rhinorrhea, neck pain, neck stiffness and ear discharge.  Eyes: Negative for pain, discharge, redness, itching and visual disturbance. Respiratory: Negative for cough, choking, chest tightness, shortness of breath, wheezing and stridor.  Cardiovascular: Negative for chest pain, palpitations and leg swelling. Gastrointestinal: Negative for abdominal distention. Genitourinary: Negative for dysuria, urgency, frequency, hematuria, flank pain, decreased urine volume, difficulty urinating and dyspareunia.  Musculoskeletal: Negative for back pain, joint swelling, arthralgia and gait problem. Neurological: Negative for dizziness, tremors, seizures, syncope, facial asymmetry, speech difficulty, weakness, light-headedness, numbness and headaches.  Hematological: Negative for adenopathy. Does not bruise/bleed easily. Psychiatric/Behavioral: Negative for hallucinations, behavioral problems, confusion, dysphoric mood, decreased concentration and agitation.    Objective:   Filed Vitals:   02/16/13 1438  BP: 168/108  Pulse: 93  Temp: 98.2 F (36.8 C)  Resp: 17    Physical Exam: Constitutional: Patient appears well-developed and well-nourished. No distress. HENT: Nasal congestion no sinus tenderness Eyes: Conjunctivae and EOM are normal. PERRLA, no scleral icterus. Neck: Normal ROM. Neck supple. No JVD. No tracheal deviation. No thyromegaly. CVS: RRR, S1/S2 +, no murmurs, no gallops, no carotid bruit.  Pulmonary: Effort and breath sounds normal, no stridor, rhonchi, minimal wheezing  Abdominal: Soft. BS +, no distension, tenderness, rebound or guarding.  Musculoskeletal: Normal range of motion. No edema and no tenderness.  Neuro:  Alert. Normal reflexes, muscle tone coordination. No cranial nerve deficit. Skin: Skin is warm and dry. No rash noted. Not diaphoretic. No erythema. No pallor. Psychiatric: Normal mood and affect. Behavior, judgment, thought content normal.  Lab Results  Component Value  Date   WBC 4.3 11/07/2009   HGB 9.9* 11/07/2009   HCT 30.1* 11/07/2009   MCV 93.5 11/07/2009   PLT 328 11/07/2009   Lab Results  Component Value Date   CREATININE 0.78 05/30/2009   BUN 13 05/30/2009   NA 137 05/30/2009   K 4.3 05/30/2009   CL 103 05/30/2009   CO2 24 05/30/2009    No results found for this basename: HGBA1C   Lipid Panel     Component Value Date/Time   CHOL 195 05/30/2009 1959   TRIG 74 05/30/2009 1959   HDL 51 05/30/2009 1959   CHOLHDL 3.8 Ratio 05/30/2009 1959   VLDL 15 05/30/2009 1959   LDLCALC 129* 05/30/2009 1959       Assessment and plan:   1. ASTHMA, UNSPECIFIED  - budesonide-formoterol (SYMBICORT) 80-4.5 MCG/ACT inhaler; Inhale 2 puffs into the lungs 2 (two) times daily.  Dispense: 1 Inhaler; Refill: 3 - albuterol (PROVENTIL HFA) 108 (90 BASE) MCG/ACT inhaler; Inhale 2 puffs into the lungs every 4 (four) hours as needed. Use for wheezing or shortness of breath  Dispense: 18 g; Refill: 3  2. Heavy menses  - Ambulatory referral to Gynecology  3. Anemia  continue with her iron supplements will check CBC - ferrous sulfate 325 (65 FE) MG EC tablet; Take 1 tablet (325 mg total) by mouth 2 (two) times daily with a meal.  Dispense: 60 tablet; Refill: 4 - CBC with Differential  4. RHINITIS, ALLERGIC Flonase  5. Essential hypertension, benign Advise for low salt diet continue with - hydrochlorothiazide (MICROZIDE) 12.5 MG capsule; Take 1 capsule (12.5 mg total) by mouth daily.  Dispense: 30 capsule; Refill: 3  6. Screening Will check - COMPLETE METABOLIC PANEL WITH GFR - TSH - Vit D  25 hydroxy (rtn osteoporosis monitoring)        Health Maintenance  -Pap Smear: Referred to GYN  Return in about 6 weeks (around 03/30/2013).   [name], MD

## 2013-02-16 NOTE — Patient Instructions (Signed)

## 2013-02-16 NOTE — Progress Notes (Signed)
Patient here to establish care Has history of asthma Today presents with elevated blood pressure but  States she does not take medication for blood pressure

## 2013-02-17 ENCOUNTER — Telehealth: Payer: Self-pay

## 2013-02-17 LAB — VITAMIN D 25 HYDROXY (VIT D DEFICIENCY, FRACTURES): Vit D, 25-Hydroxy: 22 ng/mL — ABNORMAL LOW (ref 30–89)

## 2013-02-17 NOTE — Telephone Encounter (Signed)
Message copied by Lestine Mount on Wed Feb 17, 2013 12:41 PM ------      Message from: [name]      Created: Wed Feb 17, 2013  9:22 AM       Blood work reviewed, anemia improved continue with iron supplements      , noticed low vitamin D, call patient advise to start ergocalciferol 50,000 units once a week for the duration of  12 weeks.       ------

## 2013-02-17 NOTE — Telephone Encounter (Signed)
Patient not available Left message on voice mail to return our call 

## 2013-02-25 ENCOUNTER — Ambulatory Visit: Payer: No Typology Code available for payment source | Attending: Internal Medicine

## 2013-02-26 ENCOUNTER — Other Ambulatory Visit: Payer: Self-pay | Admitting: Internal Medicine

## 2013-02-26 DIAGNOSIS — J45909 Unspecified asthma, uncomplicated: Secondary | ICD-10-CM

## 2013-02-26 MED ORDER — BUDESONIDE-FORMOTEROL FUMARATE 80-4.5 MCG/ACT IN AERO: 2.0000 | INHALATION_SPRAY | Freq: Two times a day (BID) | RESPIRATORY_TRACT | Status: DC

## 2013-02-26 MED ORDER — ALBUTEROL SULFATE HFA 108 (90 BASE) MCG/ACT IN AERS: 2.0000 | INHALATION_SPRAY | RESPIRATORY_TRACT | Status: DC | PRN

## 2013-03-05 ENCOUNTER — Other Ambulatory Visit: Payer: Self-pay | Admitting: Internal Medicine

## 2013-03-05 MED ORDER — ALBUTEROL SULFATE HFA 108 (90 BASE) MCG/ACT IN AERS
2.0000 | INHALATION_SPRAY | Freq: Four times a day (QID) | RESPIRATORY_TRACT | Status: DC | PRN
Start: 2013-03-05 — End: 2014-03-02

## 2013-03-30 ENCOUNTER — Ambulatory Visit: Payer: No Typology Code available for payment source | Admitting: Internal Medicine

## 2013-04-05 ENCOUNTER — Ambulatory Visit: Payer: No Typology Code available for payment source | Attending: Internal Medicine | Admitting: Internal Medicine

## 2013-04-05 ENCOUNTER — Encounter: Payer: Self-pay | Admitting: Internal Medicine

## 2013-04-05 VITALS — BP 130/70 | HR 100 | Temp 98.8°F | Resp 16 | Wt 218.6 lb

## 2013-04-05 DIAGNOSIS — D649 Anemia, unspecified: Secondary | ICD-10-CM | POA: Insufficient documentation

## 2013-04-05 DIAGNOSIS — J309 Allergic rhinitis, unspecified: Secondary | ICD-10-CM

## 2013-04-05 DIAGNOSIS — Z139 Encounter for screening, unspecified: Secondary | ICD-10-CM

## 2013-04-05 DIAGNOSIS — E559 Vitamin D deficiency, unspecified: Secondary | ICD-10-CM | POA: Insufficient documentation

## 2013-04-05 DIAGNOSIS — I10 Essential (primary) hypertension: Secondary | ICD-10-CM | POA: Insufficient documentation

## 2013-04-05 DIAGNOSIS — J45909 Unspecified asthma, uncomplicated: Secondary | ICD-10-CM

## 2013-04-05 DIAGNOSIS — M549 Dorsalgia, unspecified: Secondary | ICD-10-CM | POA: Insufficient documentation

## 2013-04-05 MED ORDER — NAPROXEN 500 MG PO TABS: 500.0000 mg | ORAL_TABLET | Freq: Two times a day (BID) | ORAL | Status: DC | PRN

## 2013-04-05 MED ORDER — VITAMIN D (ERGOCALCIFEROL) 1.25 MG (50000 UNIT) PO CAPS: 50000.0000 [IU] | ORAL_CAPSULE | ORAL | Status: DC

## 2013-04-05 NOTE — Progress Notes (Signed)
Patient here for follow up _HTN 

## 2013-04-05 NOTE — Progress Notes (Signed)
MRN: [id number] Name: Margean Korell  Sex: female Age: 48 y.o. DOB: [birth year]  Allergies: Review of patient's allergies indicates no known allergies.  Chief Complaint  Patient presents with  . Follow-up    HPI: Patient is 48 y.o. female who comes today for followup recently had a blood work done which was reviewed with the patient, she has history of menorrhagia and is already referred to gynecologist, her anemia is slightly improved advised patient to take iron supplements 3 times a day, also noticed vitamin D deficiency, she reported to have lower back pain for the last 2 weeks denies any fall or trauma. Patient denies any fever chills.  Past Medical History  Diagnosis Date  . Asthma     History reviewed. No pertinent past surgical history.    Medication List       This list is accurate as of: 04/05/13 11:23 AM.  Always use your most recent med list.               albuterol 108 (90 BASE) MCG/ACT inhaler  Commonly known as:  PROVENTIL HFA;VENTOLIN HFA  Inhale 2 puffs into the lungs every 6 (six) hours as needed for wheezing or shortness of breath.     budesonide-formoterol 80-4.5 MCG/ACT inhaler  Commonly known as:  SYMBICORT  Inhale 2 puffs into the lungs 2 (two) times daily.     ferrous sulfate 325 (65 FE) MG EC tablet  Take 1 tablet (325 mg total) by mouth 2 (two) times daily with a meal.     fluticasone 50 MCG/ACT nasal spray  Commonly known as:  FLONASE  Place 2 sprays into both nostrils daily.     hydrochlorothiazide 12.5 MG capsule  Commonly known as:  MICROZIDE  Take 1 capsule (12.5 mg total) by mouth daily.     naproxen 500 MG tablet  Commonly known as:  NAPROSYN  Take 1 tablet (500 mg total) by mouth 2 (two) times daily as needed. Take with food. Use for knee pain     Vitamin D (Ergocalciferol) 50000 UNITS Caps capsule  Commonly known as:  DRISDOL  Take 1 capsule (50,000 Units total) by mouth every 7 (seven) days.     WOMENS MULTIVITAMIN PLUS  Tabs  Take 1 tablet by mouth daily.        Meds ordered this encounter  Medications  . naproxen (NAPROSYN) 500 MG tablet    Sig: Take 1 tablet (500 mg total) by mouth 2 (two) times daily as needed. Take with food. Use for knee pain    Dispense:  60 tablet    Refill:  0  . Vitamin D, Ergocalciferol, (DRISDOL) 50000 UNITS CAPS capsule    Sig: Take 1 capsule (50,000 Units total) by mouth every 7 (seven) days.    Dispense:  12 capsule    Refill:  0    Immunization History  Administered Date(s) Administered  . Pneumococcal Polysaccharide-23 07/20/1998  . Td 06/19/2002    Family History  Problem Relation Age of Onset  . Diabetes Father   . Arthritis Sister   . Arthritis Daughter   . Arthritis Son   . Arthritis Paternal Aunt     History  Substance Use Topics  . Smoking status: Never Smoker   . Smokeless tobacco: Not on file  . Alcohol Use: Yes     Comment: occassional    Review of Systems   As noted in HPI  Filed Vitals:   04/05/13 1119  BP: 130/70  Pulse:   Temp:   Resp:     Physical Exam  Physical Exam  Constitutional: No distress.  Eyes: EOM are normal. Pupils are equal, round, and reactive to light.  Cardiovascular: Normal rate and regular rhythm.   Pulmonary/Chest: Breath sounds normal. No respiratory distress. She has no wheezes. She has no rales.  Musculoskeletal:  Some lower lumbar spinal and paraspinal tenderness, SLR test negative     CBC    Component Value Date/Time   WBC 4.3 02/16/2013 1506   RBC 3.45* 02/16/2013 1506   HGB 10.4* 02/16/2013 1506   HCT 31.6* 02/16/2013 1506   PLT 352 02/16/2013 1506   MCV 91.6 02/16/2013 1506   LYMPHSABS 1.7 02/16/2013 1506   MONOABS 0.3 02/16/2013 1506   EOSABS 0.2 02/16/2013 1506   BASOSABS 0.0 02/16/2013 1506    CMP     Component Value Date/Time   NA 138 02/16/2013 1506   K 3.9 02/16/2013 1506   CL 103 02/16/2013 1506   CO2 30 02/16/2013 1506   GLUCOSE 86 02/16/2013 1506   BUN 14  02/16/2013 1506   CREATININE 0.70 02/16/2013 1506   CREATININE 0.78 05/30/2009 1959   CALCIUM 8.7 02/16/2013 1506   PROT 6.8 02/16/2013 1506   ALBUMIN 4.0 02/16/2013 1506   AST 23 02/16/2013 1506   ALT 20 02/16/2013 1506   ALKPHOS 46 02/16/2013 1506   BILITOT 0.2* 02/16/2013 1506    Lab Results  Component Value Date/Time   CHOL 195 05/30/2009  7:59 PM    No components found with this basename: hga1c    Lab Results  Component Value Date/Time   AST 23 02/16/2013  3:06 PM    Assessment and Plan  Unspecified vitamin D deficiency - Plan: Vitamin D, Ergocalciferol, (DRISDOL) 50000 UNITS CAPS capsule  ANEMIA Improved  Advise to take iron supplements 3 times a day    ASTHMA, UNSPECIFIED Symptoms well controlled continue with her Symbicort and albuterol when necessary  Back pain - Plan: Advised patient to apply heating pad, naproxen (NAPROSYN) 500 MG tablet when necessary for pain  Screening - Plan: MM DIGITAL SCREENING BILATERAL   Health Maintenance  -Mammogram: ordered    Return in about 3 months (around 07/03/2013) for anemia, hypertension, asthma.  Lorayne Marek, MD

## 2013-04-05 NOTE — Patient Instructions (Signed)
DASH Diet  The DASH diet stands for "Dietary Approaches to Stop Hypertension." It is a healthy eating plan that has been shown to reduce high blood pressure (hypertension) in as little as 14 days, while also possibly providing other significant health benefits. These other health benefits include reducing the risk of breast cancer after menopause and reducing the risk of type 2 diabetes, heart disease, colon cancer, and stroke. Health benefits also include weight loss and slowing kidney failure in patients with chronic kidney disease.   DIET GUIDELINES  · Limit salt (sodium). Your diet should contain less than 1500 mg of sodium daily.  · Limit refined or processed carbohydrates. Your diet should include mostly whole grains. Desserts and added sugars should be used sparingly.  · Include small amounts of heart-healthy fats. These types of fats include nuts, oils, and tub margarine. Limit saturated and trans fats. These fats have been shown to be harmful in the body.  CHOOSING FOODS   The following food groups are based on a 2000 calorie diet. See your Registered Dietitian for individual calorie needs.  Grains and Grain Products (6 to 8 servings daily)  · Eat More Often: Whole-wheat bread, brown rice, whole-grain or wheat pasta, quinoa, popcorn without added fat or salt (air popped).  · Eat Less Often: White bread, white pasta, white rice, cornbread.  Vegetables (4 to 5 servings daily)  · Eat More Often: Fresh, frozen, and canned vegetables. Vegetables may be raw, steamed, roasted, or grilled with a minimal amount of fat.  · Eat Less Often/Avoid: Creamed or fried vegetables. Vegetables in a cheese sauce.  Fruit (4 to 5 servings daily)  · Eat More Often: All fresh, canned (in natural juice), or frozen fruits. Dried fruits without added sugar. One hundred percent fruit juice (½ cup [237 mL] daily).  · Eat Less Often: Dried fruits with added sugar. Canned fruit in light or heavy syrup.  Lean Meats, Fish, and Poultry (2  servings or less daily. One serving is 3 to 4 oz [85-114 g]).  · Eat More Often: Ninety percent or leaner ground beef, tenderloin, sirloin. Round cuts of beef, chicken breast, turkey breast. All fish. Grill, bake, or broil your meat. Nothing should be fried.  · Eat Less Often/Avoid: Fatty cuts of meat, turkey, or chicken leg, thigh, or wing. Fried cuts of meat or fish.  Dairy (2 to 3 servings)  · Eat More Often: Low-fat or fat-free milk, low-fat plain or light yogurt, reduced-fat or part-skim cheese.  · Eat Less Often/Avoid: Milk (whole, 2%). Whole milk yogurt. Full-fat cheeses.  Nuts, Seeds, and Legumes (4 to 5 servings per week)  · Eat More Often: All without added salt.  · Eat Less Often/Avoid: Salted nuts and seeds, canned beans with added salt.  Fats and Sweets (limited)  · Eat More Often: Vegetable oils, tub margarines without trans fats, sugar-free gelatin. Mayonnaise and salad dressings.  · Eat Less Often/Avoid: Coconut oils, palm oils, butter, stick margarine, cream, half and half, cookies, candy, pie.  FOR MORE INFORMATION  The Dash Diet Eating Plan: www.dashdiet.org  Document Released: 01/24/2011 Document Revised: 04/29/2011 Document Reviewed: 01/24/2011  [employer]® Patient Information ©2014 [employer].

## 2013-04-13 ENCOUNTER — Ambulatory Visit (HOSPITAL_COMMUNITY): Payer: No Typology Code available for payment source

## 2013-04-13 ENCOUNTER — Ambulatory Visit
Admission: RE | Admit: 2013-04-13 | Discharge: 2013-04-13 | Disposition: A | Discharge status: Home / Self Care | Payer: No Typology Code available for payment source | Source: Ambulatory Visit | Attending: Internal Medicine | Admitting: Internal Medicine

## 2013-04-13 ENCOUNTER — Other Ambulatory Visit: Payer: Self-pay | Admitting: Internal Medicine

## 2013-04-13 DIAGNOSIS — Z139 Encounter for screening, unspecified: Secondary | ICD-10-CM

## 2013-04-14 ENCOUNTER — Telehealth: Payer: Self-pay

## 2013-04-14 NOTE — Telephone Encounter (Signed)
Message copied by Dorothe Pea on Wed Apr 14, 2013 12:35 PM ------      Message from: Lorayne Marek      Created: Wed Apr 14, 2013 12:18 PM       Call and inform patient that her mammogram result is normal, will repeat in one year ------

## 2013-04-14 NOTE — Telephone Encounter (Signed)
Spoke with patient Aware of her mammogram results

## 2013-04-22 ENCOUNTER — Ambulatory Visit: Payer: No Typology Code available for payment source | Attending: Internal Medicine | Admitting: Family Medicine

## 2013-04-22 ENCOUNTER — Other Ambulatory Visit (HOSPITAL_COMMUNITY)
Admission: RE | Admit: 2013-04-22 | Discharge: 2013-04-22 | Disposition: A | Discharge status: Home / Self Care | Payer: No Typology Code available for payment source | Source: Ambulatory Visit | Attending: Internal Medicine | Admitting: Internal Medicine

## 2013-04-22 ENCOUNTER — Encounter: Payer: Self-pay | Admitting: Family Medicine

## 2013-04-22 VITALS — BP 152/94 | HR 95 | Temp 98.7°F | Resp 14 | Ht 65.0 in | Wt 215.0 lb

## 2013-04-22 DIAGNOSIS — R32 Unspecified urinary incontinence: Secondary | ICD-10-CM

## 2013-04-22 DIAGNOSIS — N760 Acute vaginitis: Secondary | ICD-10-CM | POA: Insufficient documentation

## 2013-04-22 DIAGNOSIS — N898 Other specified noninflammatory disorders of vagina: Secondary | ICD-10-CM

## 2013-04-22 DIAGNOSIS — R109 Unspecified abdominal pain: Secondary | ICD-10-CM | POA: Insufficient documentation

## 2013-04-22 DIAGNOSIS — H9209 Otalgia, unspecified ear: Secondary | ICD-10-CM | POA: Insufficient documentation

## 2013-04-22 NOTE — Progress Notes (Signed)
Patient is here for an office visit. Needs a refill for ferrous sulfate. Complains of a vaginal diacharge in urine x3 months, off and on. No odors or burning sensation. Also has frequent urination. Patient also complains of Lt ear pain x1 day. Pain scale of 9. Having trouble sleeping at night. No OTC medication.

## 2013-04-22 NOTE — Progress Notes (Signed)
   Subjective:    Patient ID: Regina Crawford, female    DOB: [birth year], 48 y.o.   MRN: [id number]  HPI Mrs. Regina Earing is a 48 y.o. women that presents with urinary incontinence, let ear pain, vaginal discharge, and abdominal pain. Her urinary incontinence started approximately one year ago when she coughed or sneezed. It has now progressed to a constant leakage that is worse at night. Aggravating factors are coughing or sneezing and no relieving factors. She denies any pain when urinating. Her abdominal pain started also around one year ago and is periumbilical that is worse with meals. She rates the pain as a 5-6 on  a scale of 0-10. Advil is used for pain relief. Her left ear pain started 2-3 days ago is constant with no aggravating or relieving factors. The patient states her las pelvic exam was 8 years ago and she would like to get an exam. She denies any vaginal discharge at this time. incontinance - nsvd 2008, 1 prior  vag d/c - pelvic for 8 years  abd pain - intermittant, worse with food, 5-9/56, periumbillical  Left ear pain - throbbing  Review of Systems She denies any further problems at this time and her review of systems is negative except for problems above in HPI.    Objective:   Physical Exam  Nursing note and vitals reviewed. Constitutional: She is oriented to person, place, and time. She appears well-developed and well-nourished.  HENT:  Right Ear: External ear normal.  Left Ear: External ear normal.  Nose: Nose normal.  Mouth/Throat: Oropharynx is clear and moist. No oropharyngeal exudate.  Eyes: Conjunctivae are normal. Pupils are equal, round, and reactive to light.  Neck: Normal range of motion. Neck supple. No thyromegaly present.  Cardiovascular: Normal rate, regular rhythm and normal heart sounds.   Pulmonary/Chest: Effort normal and breath sounds normal.  Abdominal: Soft. Bowel sounds are normal. She exhibits no distension. There is no tenderness. There is no  rebound.  Lymphadenopathy:    She has no cervical adenopathy.  Neurological: She is alert and oriented to person, place, and time. She has normal reflexes.  Skin: Skin is warm and dry.  Psychiatric: She has a normal mood and affect. Her behavior is normal.  Pelvic - normal physiologic d/c, no cmt, no adnexal ttp but did      Assessment & Plan:  incont - ua, ucx, may need uro referral  abd pin - suspect consitpation, will see if miralax resolves it   Plan: 1. Transvaginal Ultrasound  2. Urinalysis and culture 3. Anemia Panel 4. Miralax Patient advised of plan and instructed to use Miralalx and to follow up in 2-4 weeks and as needed.  attending note:  Agree with above as outlined by NP student.  Medical screening examination/treatment/procedure(s) were conducted as a shared visit with non-physician practitioner(s) and myself.  I personally evaluated the patient during the encounter.   rtc 4 weeks for f/u. rtc earlier if needed, ED for acute issues   Tifini was seen today for office visit.  Diagnoses and associated orders for this visit:  Vaginal discharge - Hemoccult - 1 Card (office) - HIV antibody (by protocol)  Incontinence - POCT urinalysis dipstick - Urine culture  Other Orders  - Cervicovaginal ancillary only

## 2013-04-26 LAB — CERVICOVAGINAL ANCILLARY ONLY
Bacterial vaginitis: POSITIVE — AB
Bacterial vaginitis: POSITIVE — AB
CANDIDA VAGINITIS: NEGATIVE

## 2013-04-29 ENCOUNTER — Emergency Department (HOSPITAL_COMMUNITY)
Admission: EM | Admit: 2013-04-29 | Discharge: 2013-04-29 | Disposition: A | Discharge status: Home / Self Care | Payer: No Typology Code available for payment source | Source: Home / Self Care

## 2013-04-29 ENCOUNTER — Encounter (HOSPITAL_COMMUNITY): Payer: Self-pay | Admitting: Emergency Medicine

## 2013-04-29 DIAGNOSIS — R590 Localized enlarged lymph nodes: Secondary | ICD-10-CM

## 2013-04-29 DIAGNOSIS — R599 Enlarged lymph nodes, unspecified: Secondary | ICD-10-CM

## 2013-04-29 DIAGNOSIS — H9209 Otalgia, unspecified ear: Secondary | ICD-10-CM

## 2013-04-29 DIAGNOSIS — H9202 Otalgia, left ear: Secondary | ICD-10-CM

## 2013-04-29 MED ORDER — DICLOFENAC SODIUM 1 % TD GEL: 1.0000 "application " | Freq: Four times a day (QID) | TRANSDERMAL | Status: DC

## 2013-04-29 MED ORDER — HYDROCODONE-ACETAMINOPHEN 5-325 MG PO TABS: 1.0000 | ORAL_TABLET | ORAL | Status: DC | PRN

## 2013-04-29 MED ORDER — CEFTRIAXONE SODIUM 1 G IJ SOLR
INTRAMUSCULAR | Status: AC
  Filled 2013-04-29: qty 10

## 2013-04-29 MED ORDER — CLINDAMYCIN HCL 300 MG PO CAPS: 300.0000 mg | ORAL_CAPSULE | Freq: Four times a day (QID) | ORAL | Status: DC

## 2013-04-29 MED ORDER — CEFTRIAXONE SODIUM 1 G IJ SOLR
1.0000 g | Freq: Once | INTRAMUSCULAR | Status: AC
  Administered 2013-04-29: 1 g via INTRAMUSCULAR

## 2013-04-29 NOTE — ED Provider Notes (Signed)
Medical screening examination/treatment/procedure(s) were performed by a resident physician or non-physician practitioner and as the supervising physician I was immediately available for consultation/collaboration.  Camala Talwar, MD    Akari Crysler S Ayumi Wangerin, MD 04/29/13 1413 

## 2013-04-29 NOTE — ED Notes (Signed)
Pt  Reports  l earache  For  sev  Days           Pt   Reports  Was  Seen  At [facility]  [place]

## 2013-04-29 NOTE — Discharge Instructions (Signed)
Cervical Adenitis You have a swollen lymph gland in your neck. This commonly happens with Strep and virus infections, dental problems, insect bites, and injuries about the face, scalp, or neck. The lymph glands swell as the body fights the infection or heals the injury. Swelling and firmness typically lasts for several weeks after the infection or injury is healed. Rarely lymph glands can become swollen because of cancer or TB. Antibiotics are prescribed if there is evidence of an infection. Sometimes an infected lymph gland becomes filled with pus. This condition may require opening up the abscessed gland by draining it surgically. Most of the time infected glands return to normal within two weeks. Do not poke or squeeze the swollen lymph nodes. That may keep them from shrinking back to their normal size. If the lymph gland is still swollen after 2 weeks, further medical evaluation is needed.  SEEK IMMEDIATE MEDICAL CARE IF:  You have difficulty swallowing or breathing, increased swelling, severe pain, or a high fever.  Document Released: 02/04/2005 Document Revised: 04/29/2011 Document Reviewed: 07/27/2006 [facility] Patient Information 2014 [place], [place].  Lymphadenopathy Lymphadenopathy means "disease of the lymph glands." But the term is usually used to describe swollen or enlarged lymph glands, also called lymph nodes. These are the bean-shaped organs found in many locations including the neck, underarm, and groin. Lymph glands are part of the immune system, which fights infections in your body. Lymphadenopathy can occur in just one area of the body, such as the neck, or it can be generalized, with lymph node enlargement in several areas. The nodes found in the neck are the most common sites of lymphadenopathy. CAUSES  When your immune system responds to germs (such as viruses or bacteria ), infection-fighting cells and fluid build up. This causes the glands to grow in size. This is usually not  something to worry about. Sometimes, the glands themselves can become infected and inflamed. This is called lymphadenitis. Enlarged lymph nodes can be caused by many diseases:  Bacterial disease, such as strep throat or a skin infection.  Viral disease, such as a common cold.  Other germs, such as lyme disease, tuberculosis, or sexually transmitted diseases.  Cancers, such as lymphoma (cancer of the lymphatic system) or leukemia (cancer of the white blood cells).  Inflammatory diseases such as lupus or rheumatoid arthritis.  Reactions to medications. Many of the diseases above are rare, but important. This is why you should see your caregiver if you have lymphadenopathy. SYMPTOMS   Swollen, enlarged lumps in the neck, back of the head or other locations.  Tenderness.  Warmth or redness of the skin over the lymph nodes.  Fever. DIAGNOSIS  Enlarged lymph nodes are often near the source of infection. They can help healthcare providers diagnose your illness. For instance:   Swollen lymph nodes around the jaw might be caused by an infection in the mouth.  Enlarged glands in the neck often signal a throat infection.  Lymph nodes that are swollen in more than one area often indicate an illness caused by a virus. Your caregiver most likely will know what is causing your lymphadenopathy after listening to your history and examining you. Blood tests, x-rays or other tests may be needed. If the cause of the enlarged lymph node cannot be found, and it does not go away by itself, then a biopsy may be needed. Your caregiver will discuss this with you. TREATMENT  Treatment for your enlarged lymph nodes will depend on the cause. Many times the  nodes will shrink to normal size by themselves, with no treatment. Antibiotics or other medicines may be needed for infection. Only take over-the-counter or prescription medicines for pain, discomfort or fever as directed by your caregiver. HOME CARE  INSTRUCTIONS  Swollen lymph glands usually return to normal when the underlying medical condition goes away. If they persist, contact your health-care provider. He/she might prescribe antibiotics or other treatments, depending on the diagnosis. Take any medications exactly as prescribed. Keep any follow-up appointments made to check on the condition of your enlarged nodes.  SEEK MEDICAL CARE IF:   Swelling lasts for more than two weeks.  You have symptoms such as weight loss, night sweats, fatigue or fever that does not go away.  The lymph nodes are hard, seem fixed to the skin or are growing rapidly.  Skin over the lymph nodes is red and inflamed. This could mean there is an infection. SEEK IMMEDIATE MEDICAL CARE IF:   Fluid starts leaking from the area of the enlarged lymph node.  You develop a fever of 102 F (38.9 C) or greater.  Severe pain develops (not necessarily at the site of a large lymph node).  You develop chest pain or shortness of breath.  You develop worsening abdominal pain. MAKE SURE YOU:   Understand these instructions.  Will watch your condition.  Will get help right away if you are not doing well or get worse. Document Released: 11/14/2007 Document Revised: 04/29/2011 Document Reviewed: 11/14/2007 [facility] Patient Information 2014 [place].  Musculoskeletal Pain Musculoskeletal pain is muscle and boney aches and pains. These pains can occur in any part of the body. Your caregiver may treat you without knowing the cause of the pain. They may treat you if blood or urine tests, X-rays, and other tests were normal.  CAUSES There is often not a definite cause or reason for these pains. These pains may be caused by a type of germ (virus). The discomfort may also come from overuse. Overuse includes working out too hard when your body is not fit. Boney aches also come from weather changes. Bone is sensitive to atmospheric pressure changes. HOME CARE  INSTRUCTIONS   Ask when your test results will be ready. Make sure you get your test results.  Only take over-the-counter or prescription medicines for pain, discomfort, or fever as directed by your caregiver. If you were given medications for your condition, do not drive, operate machinery or power tools, or sign legal documents for 24 hours. Do not drink alcohol. Do not take sleeping pills or other medications that may interfere with treatment.  Continue all activities unless the activities cause more pain. When the pain lessens, slowly resume normal activities. Gradually increase the intensity and duration of the activities or exercise.  During periods of severe pain, bed rest may be helpful. Lay or sit in any position that is comfortable.  Putting ice on the injured area.  Put ice in a bag.  Place a towel between your skin and the bag.  Leave the ice on for 15 to 20 minutes, 3 to 4 times a day.  Follow up with your caregiver for continued problems and no reason can be found for the pain. If the pain becomes worse or does not go away, it may be necessary to repeat tests or do additional testing. Your caregiver may need to look further for a possible cause. SEEK IMMEDIATE MEDICAL CARE IF:  You have pain that is getting worse and is not relieved by  medications.  You develop chest pain that is associated with shortness or breath, sweating, feeling sick to your stomach (nauseous), or throw up (vomit).  Your pain becomes localized to the abdomen.  You develop any new symptoms that seem different or that concern you. MAKE SURE YOU:   Understand these instructions.  Will watch your condition.  Will get help right away if you are not doing well or get worse. Document Released: 02/04/2005 Document Revised: 04/29/2011 Document Reviewed: 10/09/2012 [facility] Patient Information 2014 [place].

## 2013-04-29 NOTE — ED Provider Notes (Signed)
CSN: [id number]     Arrival date & time 04/29/13  1015 History   First MD Initiated Contact with Patient 04/29/13 1109     Chief Complaint  Patient presents with  . Otalgia   (Consider location/radiation/quality/duration/timing/severity/associated sxs/prior Treatment) HPI Comments: 48 y o F with L ear pain for 1 week. No drainage, problem with hearing or URI sx's. Pt points to the area behind the ear as source of pain. Tracks down to the trapezius. Worse with head rotation, esp to the right.    Past Medical History  Diagnosis Date  . Asthma    History reviewed. No pertinent past surgical history. Family History  Problem Relation Age of Onset  . Diabetes Father   . Arthritis Sister   . Arthritis Daughter   . Arthritis Son   . Arthritis Paternal Aunt    History  Substance Use Topics  . Smoking status: Never Smoker   . Smokeless tobacco: Not on file  . Alcohol Use: Yes     Comment: occassional   OB History   Grav Para Term Preterm Abortions TAB SAB Ect Mult Living                 Review of Systems  Constitutional: Negative.   HENT: Positive for ear pain. Negative for congestion, ear discharge, postnasal drip, rhinorrhea and sore throat.   Eyes: Negative.   Respiratory: Negative.   Cardiovascular: Negative.   All other systems reviewed and are negative.    Allergies  Review of patient's allergies indicates no known allergies.  Home Medications   Current Outpatient Rx  Name  Route  Sig  Dispense  Refill  . albuterol (PROVENTIL HFA;VENTOLIN HFA) 108 (90 BASE) MCG/ACT inhaler   Inhalation   Inhale 2 puffs into the lungs every 6 (six) hours as needed for wheezing or shortness of breath.   3 Inhaler   4   . budesonide-formoterol (SYMBICORT) 80-4.5 MCG/ACT inhaler   Inhalation   Inhale 2 puffs into the lungs 2 (two) times daily.   3 Inhaler   3   . clindamycin (CLEOCIN) 300 MG capsule   Oral   Take 1 capsule (300 mg total) by mouth 4 (four) times daily. X 10  days   40 capsule   0   . diclofenac sodium (VOLTAREN) 1 % GEL   Topical   Apply 1 application topically 4 (four) times daily.   100 g   0   . ferrous sulfate 325 (65 FE) MG EC tablet   Oral   Take 1 tablet (325 mg total) by mouth 2 (two) times daily with a meal.   60 tablet   4   . fluticasone (FLONASE) 50 MCG/ACT nasal spray   Each Nare   Place 2 sprays into both nostrils daily.   16 g   6   . hydrochlorothiazide (MICROZIDE) 12.5 MG capsule   Oral   Take 1 capsule (12.5 mg total) by mouth daily.   30 capsule   3   . HYDROcodone-acetaminophen (NORCO/VICODIN) 5-325 MG per tablet   Oral   Take 1 tablet by mouth every 4 (four) hours as needed.   15 tablet   0   . Multiple Vitamins-Minerals (WOMENS MULTIVITAMIN PLUS) TABS   Oral   Take 1 tablet by mouth daily.           . naproxen (NAPROSYN) 500 MG tablet   Oral   Take 1 tablet (500 mg total) by mouth 2 (two)  times daily as needed. Take with food. Use for knee pain   60 tablet   0   . Vitamin D, Ergocalciferol, (DRISDOL) 50000 UNITS CAPS capsule   Oral   Take 1 capsule (50,000 Units total) by mouth every 7 (seven) days.   12 capsule   0    BP 151/93  Pulse 91  Temp(Src) 99 F (37.2 C) (Oral)  Resp 16  SpO2 99%  LMP 04/04/2013 Physical Exam  Nursing note and vitals reviewed. Constitutional: She is oriented to person, place, and time. She appears well-developed and well-nourished. No distress.  HENT:  Head: Normocephalic and atraumatic.  Mouth/Throat: Oropharynx is clear and moist.  Bilat TM's nl.  Tenderness to the skin, as well as deeper tissues overlying the post auricular area, the mastoid and the paracervical muscles and upper trapezoid muscle.  No skin color changes, no swelling. Positive for 2 tender, mildly enlarged lymph nodes along the tract.  No introral signs of infection, no dental tenderness, OP clear  Eyes: Conjunctivae and EOM are normal.  Neck: Normal range of motion. Neck supple.   Cardiovascular: Normal rate and normal heart sounds.   Pulmonary/Chest: Effort normal and breath sounds normal.  Lymphadenopathy:    She has no cervical adenopathy.  Neurological: She is alert and oriented to person, place, and time.  Skin: Skin is warm and dry.  Psychiatric: She has a normal mood and affect.    ED Course  Procedures (including critical care time) Labs Review Labs Reviewed - No data to display Imaging Review No results found.   MDM   1. Posterior auricular pain of left ear   2. Lymphadenopathy, posterior cervical      Differential includes localized skin infection, muscle pain and less likely mastoiditis.  tx with Rocephin 1 gm Clindamycin norco 5 mg #15 and diclofenac gel See PCP in 1-2 daYS    Barry SOONER FOR [facility]BLems   Janne Napoleon, NP 04/29/13 1134

## 2013-04-30 ENCOUNTER — Encounter: Payer: No Typology Code available for payment source | Admitting: Family Medicine

## 2013-05-25 ENCOUNTER — Encounter: Payer: Self-pay | Admitting: Internal Medicine

## 2013-05-25 ENCOUNTER — Ambulatory Visit: Payer: No Typology Code available for payment source | Attending: Internal Medicine | Admitting: Internal Medicine

## 2013-05-25 VITALS — BP 154/89 | HR 90 | Temp 98.3°F | Resp 16 | Wt 213.8 lb

## 2013-05-25 DIAGNOSIS — D649 Anemia, unspecified: Secondary | ICD-10-CM | POA: Insufficient documentation

## 2013-05-25 DIAGNOSIS — G4733 Obstructive sleep apnea (adult) (pediatric): Secondary | ICD-10-CM | POA: Insufficient documentation

## 2013-05-25 DIAGNOSIS — Z9989 Dependence on other enabling machines and devices: Secondary | ICD-10-CM

## 2013-05-25 DIAGNOSIS — I10 Essential (primary) hypertension: Secondary | ICD-10-CM | POA: Insufficient documentation

## 2013-05-25 DIAGNOSIS — J45909 Unspecified asthma, uncomplicated: Secondary | ICD-10-CM | POA: Insufficient documentation

## 2013-05-25 MED ORDER — HYDROCHLOROTHIAZIDE 12.5 MG PO CAPS: 12.5000 mg | ORAL_CAPSULE | Freq: Two times a day (BID) | ORAL | Status: DC

## 2013-05-25 NOTE — Progress Notes (Signed)
Patient here for follow up on her back pain and anemia

## 2013-05-25 NOTE — Progress Notes (Signed)
MRN: [id number] Name: Regina Crawford  Sex: female Age: 48 y.o. DOB: November 28, 1965  Allergies: Review of patient's allergies indicates no known allergies.  Chief Complaint  Patient presents with  . Follow-up    HPI: Patient is 48 y.o. female who history of hypertension anemia asthma, OSA on CPAP comes today for followup, today her blood pressure is elevated she has been taking hydrochlorothiazide 12.5 mg daily, also noticed her blood pressure was high on the last visit, she also history of OSA on CPAP machine for several years, has not followed up with the specialist, has been using machine every night, has not had any recent sleep studies done. Patient denies any headache dizziness chest and shortness of breath.  Past Medical History  Diagnosis Date  . Asthma     History reviewed. No pertinent past surgical history.    Medication List       This list is accurate as of: 05/25/13 11:07 AM.  Always use your most recent med list.               albuterol 108 (90 BASE) MCG/ACT inhaler  Commonly known as:  PROVENTIL HFA;VENTOLIN HFA  Inhale 2 puffs into the lungs every 6 (six) hours as needed for wheezing or shortness of breath.     budesonide-formoterol 80-4.5 MCG/ACT inhaler  Commonly known as:  SYMBICORT  Inhale 2 puffs into the lungs 2 (two) times daily.     clindamycin 300 MG capsule  Commonly known as:  CLEOCIN  Take 1 capsule (300 mg total) by mouth 4 (four) times daily. X 10 days     diclofenac sodium 1 % Gel  Commonly known as:  VOLTAREN  Apply 1 application topically 4 (four) times daily.     ferrous sulfate 325 (65 FE) MG EC tablet  Take 1 tablet (325 mg total) by mouth 2 (two) times daily with a meal.     fluticasone 50 MCG/ACT nasal spray  Commonly known as:  FLONASE  Place 2 sprays into both nostrils daily.     hydrochlorothiazide 12.5 MG capsule  Commonly known as:  MICROZIDE  Take 1 capsule (12.5 mg total) by mouth 2 (two) times daily.     HYDROcodone-acetaminophen 5-325 MG per tablet  Commonly known as:  NORCO/VICODIN  Take 1 tablet by mouth every 4 (four) hours as needed.     naproxen 500 MG tablet  Commonly known as:  NAPROSYN  Take 1 tablet (500 mg total) by mouth 2 (two) times daily as needed. Take with food. Use for knee pain     Vitamin D (Ergocalciferol) 50000 UNITS Caps capsule  Commonly known as:  DRISDOL  Take 1 capsule (50,000 Units total) by mouth every 7 (seven) days.     WOMENS MULTIVITAMIN PLUS Tabs  Take 1 tablet by mouth daily.        Meds ordered this encounter  Medications  . hydrochlorothiazide (MICROZIDE) 12.5 MG capsule    Sig: Take 1 capsule (12.5 mg total) by mouth 2 (two) times daily.    Dispense:  60 capsule    Refill:  3    Immunization History  Administered Date(s) Administered  . Pneumococcal Polysaccharide-23 07/20/1998  . Td 06/19/2002    Family History  Problem Relation Age of Onset  . Diabetes Father   . Arthritis Sister   . Arthritis Daughter   . Arthritis Son   . Arthritis Paternal Aunt     History  Substance Use Topics  . Smoking  status: Never Smoker   . Smokeless tobacco: Not on file  . Alcohol Use: Yes     Comment: occassional    Review of Systems   As noted in HPI  Filed Vitals:   05/25/13 1049  BP: 154/89  Pulse: 90  Temp: 98.3 F (36.8 C)  Resp: 16    Physical Exam  Physical Exam  Constitutional: No distress.  Eyes: EOM are normal. Pupils are equal, round, and reactive to light.  Cardiovascular: Normal rate and regular rhythm.   Pulmonary/Chest: Breath sounds normal. No respiratory distress. She has no wheezes. She has no rales.    CBC    Component Value Date/Time   WBC 4.3 02/16/2013 1506   RBC 3.45* 02/16/2013 1506   HGB 10.4* 02/16/2013 1506   HCT 31.6* 02/16/2013 1506   PLT 352 02/16/2013 1506   MCV 91.6 02/16/2013 1506   LYMPHSABS 1.7 02/16/2013 1506   MONOABS 0.3 02/16/2013 1506   EOSABS 0.2 02/16/2013 1506   BASOSABS 0.0  02/16/2013 1506    CMP     Component Value Date/Time   NA 138 02/16/2013 1506   K 3.9 02/16/2013 1506   CL 103 02/16/2013 1506   CO2 30 02/16/2013 1506   GLUCOSE 86 02/16/2013 1506   BUN 14 02/16/2013 1506   CREATININE 0.70 02/16/2013 1506   CREATININE 0.78 05/30/2009 1959   CALCIUM 8.7 02/16/2013 1506   PROT 6.8 02/16/2013 1506   ALBUMIN 4.0 02/16/2013 1506   AST 23 02/16/2013 1506   ALT 20 02/16/2013 1506   ALKPHOS 46 02/16/2013 1506   BILITOT 0.2* 02/16/2013 1506   GFRNONAA >89 02/16/2013 1506   GFRAA >89 02/16/2013 1506    Lab Results  Component Value Date/Time   CHOL 195 05/30/2009  7:59 PM    No components found with this basename: hga1c    Lab Results  Component Value Date/Time   AST 23 02/16/2013  3:06 PM    Assessment and Plan  Essential hypertension, benign - Plan: I have increased the dose of hydrochlorothiazide to 12.5 twice a day hydrochlorothiazide (MICROZIDE) 12.5 MG capsule  Anemia, unspecified Continue with iron supplement, repeat CBC on the next visit  Unspecified asthma(493.90) Well controlled continue with Symbicort and albuterol when necessary  OSA on CPAP - Plan: Ambulatory referral to Sleep Studies  Return in about 4 months (around 09/24/2013) for hypertension, anemia.  Lorayne Marek, MD

## 2013-05-25 NOTE — Patient Instructions (Signed)
DASH Diet  The DASH diet stands for "Dietary Approaches to Stop Hypertension." It is a healthy eating plan that has been shown to reduce high blood pressure (hypertension) in as little as 14 days, while also possibly providing other significant health benefits. These other health benefits include reducing the risk of breast cancer after menopause and reducing the risk of type 2 diabetes, heart disease, colon cancer, and stroke. Health benefits also include weight loss and slowing kidney failure in patients with chronic kidney disease.   DIET GUIDELINES  · Limit salt (sodium). Your diet should contain less than 1500 mg of sodium daily.  · Limit refined or processed carbohydrates. Your diet should include mostly whole grains. Desserts and added sugars should be used sparingly.  · Include small amounts of heart-healthy fats. These types of fats include nuts, oils, and tub margarine. Limit saturated and trans fats. These fats have been shown to be harmful in the body.  CHOOSING FOODS   The following food groups are based on a 2000 calorie diet. See your Registered Dietitian for individual calorie needs.  Grains and Grain Products (6 to 8 servings daily)  · Eat More Often: Whole-wheat bread, brown rice, whole-grain or wheat pasta, quinoa, popcorn without added fat or salt (air popped).  · Eat Less Often: White bread, white pasta, white rice, cornbread.  Vegetables (4 to 5 servings daily)  · Eat More Often: Fresh, frozen, and canned vegetables. Vegetables may be raw, steamed, roasted, or grilled with a minimal amount of fat.  · Eat Less Often/Avoid: Creamed or fried vegetables. Vegetables in a cheese sauce.  Fruit (4 to 5 servings daily)  · Eat More Often: All fresh, canned (in natural juice), or frozen fruits. Dried fruits without added sugar. One hundred percent fruit juice (½ cup [237 mL] daily).  · Eat Less Often: Dried fruits with added sugar. Canned fruit in light or heavy syrup.  Lean Meats, Fish, and Poultry (2  servings or less daily. One serving is 3 to 4 oz [85-114 g]).  · Eat More Often: Ninety percent or leaner ground beef, tenderloin, sirloin. Round cuts of beef, chicken breast, turkey breast. All fish. Grill, bake, or broil your meat. Nothing should be fried.  · Eat Less Often/Avoid: Fatty cuts of meat, turkey, or chicken leg, thigh, or wing. Fried cuts of meat or fish.  Dairy (2 to 3 servings)  · Eat More Often: Low-fat or fat-free milk, low-fat plain or light yogurt, reduced-fat or part-skim cheese.  · Eat Less Often/Avoid: Milk (whole, 2%). Whole milk yogurt. Full-fat cheeses.  Nuts, Seeds, and Legumes (4 to 5 servings per week)  · Eat More Often: All without added salt.  · Eat Less Often/Avoid: Salted nuts and seeds, canned beans with added salt.  Fats and Sweets (limited)  · Eat More Often: Vegetable oils, tub margarines without trans fats, sugar-free gelatin. Mayonnaise and salad dressings.  · Eat Less Often/Avoid: Coconut oils, palm oils, butter, stick margarine, cream, half and half, cookies, candy, pie.  FOR MORE INFORMATION  The Dash Diet Eating Plan: www.dashdiet.org  Document Released: 01/24/2011 Document Revised: 04/29/2011 Document Reviewed: 01/24/2011  [employer]® Patient Information ©2014 [employer].

## 2013-05-28 ENCOUNTER — Telehealth: Payer: Self-pay | Admitting: Internal Medicine

## 2013-05-28 NOTE — Telephone Encounter (Signed)
Pt returning call about results.

## 2013-05-28 NOTE — Telephone Encounter (Signed)
Pt has come in today to request a referral for [facility]; please f/u with pt about referral

## 2013-06-21 ENCOUNTER — Encounter: Payer: No Typology Code available for payment source | Admitting: Obstetrics & Gynecology

## 2013-07-05 ENCOUNTER — Ambulatory Visit: Payer: No Typology Code available for payment source | Admitting: Internal Medicine

## 2013-08-08 ENCOUNTER — Ambulatory Visit (HOSPITAL_BASED_OUTPATIENT_CLINIC_OR_DEPARTMENT_OTHER): Payer: No Typology Code available for payment source

## 2013-08-09 ENCOUNTER — Encounter: Payer: Self-pay | Admitting: Obstetrics & Gynecology

## 2013-08-09 ENCOUNTER — Ambulatory Visit (INDEPENDENT_AMBULATORY_CARE_PROVIDER_SITE_OTHER): Payer: Self-pay | Admitting: Obstetrics & Gynecology

## 2013-08-09 VITALS — BP 144/86 | HR 98 | Temp 98.6°F | Ht 61.0 in | Wt 214.2 lb

## 2013-08-09 DIAGNOSIS — N852 Hypertrophy of uterus: Secondary | ICD-10-CM

## 2013-08-09 DIAGNOSIS — D259 Leiomyoma of uterus, unspecified: Secondary | ICD-10-CM | POA: Insufficient documentation

## 2013-08-09 LAB — CBC
HCT: 31.6 % — ABNORMAL LOW (ref 36.0–46.0)
HEMOGLOBIN: 10.5 g/dL — AB (ref 12.0–15.0)
MCH: 30.3 pg (ref 26.0–34.0)
MCHC: 33.2 g/dL (ref 30.0–36.0)
MCV: 91.1 fL (ref 78.0–100.0)
Platelets: 351 10*3/uL (ref 150–400)
RBC: 3.47 MIL/uL — AB (ref 3.87–5.11)
RDW: 14.1 % (ref 11.5–15.5)
WBC: 4.6 10*3/uL (ref 4.0–10.5)

## 2013-08-09 LAB — POCT PREGNANCY, URINE: Preg Test, Ur: NEGATIVE

## 2013-08-09 NOTE — Progress Notes (Signed)
Patient ID: Regina Crawford, female   DOB: 28-Aug-1965, 48 y.o.   MRN: [id number]  Chief Complaint  Patient presents with  . Referral    from [facility]  . Menorrhagia    heavy menses for 7-12 months, lasting 8-9 days; sometimes gets period twice a month    HPI Regina Crawford is a 48 y.o. female.  [id number] Patient's last menstrual period was 07/18/2013.   Chief Complaint  Patient presents with  . Referral    from [facility]  . Menorrhagia    heavy menses for 7-12 months, lasting 8-9 days; sometimes gets period twice a month   HPI  Past Medical History  Diagnosis Date  . Asthma   . Hypertension     History reviewed. No pertinent past surgical history.  Family History  Problem Relation Age of Onset  . Diabetes Father   . Arthritis Sister   . Arthritis Daughter   . Arthritis Son   . Arthritis Paternal Aunt     Social History History  Substance Use Topics  . Smoking status: Never Smoker   . Smokeless tobacco: Not on file  . Alcohol Use: Yes     Comment: occassional    No Known Allergies  Current Outpatient Prescriptions  Medication Sig Dispense Refill  . albuterol (PROVENTIL HFA;VENTOLIN HFA) 108 (90 BASE) MCG/ACT inhaler Inhale 2 puffs into the lungs every 6 (six) hours as needed for wheezing or shortness of breath.  3 Inhaler  4  . budesonide-formoterol (SYMBICORT) 80-4.5 MCG/ACT inhaler Inhale 2 puffs into the lungs 2 (two) times daily.  3 Inhaler  3  . ferrous sulfate 325 (65 FE) MG EC tablet Take 1 tablet (325 mg total) by mouth 2 (two) times daily with a meal.  60 tablet  4  . fluticasone (FLONASE) 50 MCG/ACT nasal spray Place 2 sprays into both nostrils daily.  16 g  6  . hydrochlorothiazide (MICROZIDE) 12.5 MG capsule Take 1 capsule (12.5 mg total) by mouth 2 (two) times daily.  60 capsule  3  . Multiple Vitamins-Minerals (WOMENS MULTIVITAMIN PLUS) TABS Take 1 tablet by mouth daily.        . naproxen (NAPROSYN) 500 MG tablet  Take 1 tablet (500 mg total) by mouth 2 (two) times daily as needed. Take with food. Use for knee pain  60 tablet  0  . Vitamin D, Ergocalciferol, (DRISDOL) 50000 UNITS CAPS capsule Take 1 capsule (50,000 Units total) by mouth every 7 (seven) days.  12 capsule  0   No current facility-administered medications for this visit.    Review of Systems Review of Systems  Constitutional: Negative.   Gastrointestinal: Negative.   Genitourinary: Positive for menstrual problem and pelvic pain. Negative for vaginal bleeding.  Neurological: Negative for dizziness.    Blood pressure 144/86, pulse 98, temperature 98.6 F (37 C), temperature source Oral, height 5\' 1"  (1.549 m), weight 214 lb 3.2 oz (97.16 kg), last menstrual period 07/18/2013.  Physical Exam Physical Exam  Constitutional: She appears well-developed. No distress.  Pulmonary/Chest: Effort normal.  Abdominal: Soft. She exhibits mass.  Genitourinary: No vaginal discharge found.  Uterus 12+ weeks  Neurological: She is alert.  Skin: Skin is warm and dry.  Psychiatric: She has a normal mood and affect. Her behavior is normal.    Data Reviewed labs  Assessment    Enlarged uterus and menometrorrhagia     Plan    Pelvic US, CBC,  RTC 2 weeks  ARNOLD,JAMES 08/09/2013, 4:40 PM

## 2013-08-16 ENCOUNTER — Ambulatory Visit: Payer: Self-pay | Admitting: Internal Medicine

## 2013-08-17 ENCOUNTER — Ambulatory Visit (HOSPITAL_COMMUNITY): Payer: Self-pay

## 2013-08-19 ENCOUNTER — Ambulatory Visit (HOSPITAL_BASED_OUTPATIENT_CLINIC_OR_DEPARTMENT_OTHER): Payer: No Typology Code available for payment source | Attending: Internal Medicine | Admitting: Radiology

## 2013-08-19 VITALS — Ht 60.0 in | Wt 210.0 lb

## 2013-08-19 DIAGNOSIS — G4733 Obstructive sleep apnea (adult) (pediatric): Secondary | ICD-10-CM | POA: Insufficient documentation

## 2013-08-21 DIAGNOSIS — G4733 Obstructive sleep apnea (adult) (pediatric): Secondary | ICD-10-CM

## 2013-08-21 NOTE — Sleep Study (Signed)
   NAME: Regina Crawford DATE OF BIRTH:  08-27-1965 MEDICAL RECORD NUMBER [id number]  LOCATION: [facility]  PHYSICIAN: YOUNG,CLINTON D  DATE OF STUDY: 08/19/2013  SLEEP STUDY TYPE: Nocturnal Polysomnogram               REFERRING PHYSICIAN: Lorayne Marek, MD  INDICATION FOR STUDY: Hypersomnia with sleep apnea  EPWORTH SLEEPINESS SCORE:   6/24  HEIGHT: 5' (152.4 cm)  WEIGHT: 210 lb (95.255 kg)    Body mass index is 41.01 kg/(m^2).  NECK SIZE: 15 in.  MEDICATIONS: Charted for review  SLEEP ARCHITECTURE: Total sleep time 331.5 minutes with sleep efficiency 81.1%. Stage I was 5.1%, stage II 76%, stage III absent, REM 18.9% of total sleep time. Sleep latency 39.5 minutes, REM latency 55.5 minutes, awake after sleep onset 39 minutes, arousal index 10.5, bedtime medication: Ventolin inhaler, Symbicort inhaler taken for wheezing.  RESPIRATORY DATA: Apnea hypotony index (AHI) 12.3 per hour. 68 total events scored including 21 obstructive apneas and 47 hypopneas. Events were seen in all positions. REM AHI 54.7 per hour. There were not enough early events to meet protocol requirements for split CPAP titration.  OXYGEN DATA: Moderately loud to very loud snoring with oxygen desaturation to a nadir of 76% and a mean saturation to the study of 96.5% on room air.  CARDIAC DATA: Normal sinus rhythm.   MOVEMENT/PARASOMNIA: No significant movement disturbance, bathroom x2  IMPRESSION/ RECOMMENDATION:   1) Mild obstructive sleep apnea/hypopneas syndrome, AHI 12.3 per hour with non-positional events. REM AHI 54.7 per hour. Moderate to very loud snoring with oxygen desaturation to a nadir of 76% and a mean saturation through the study of 96.5% on room air. 2) Most events were associated with REM sleep, after 2 AM. There were not enough early events to qualify for split protocol CPAP titration. If conservative measures including weight loss and possibly treatment for upper airway  obstruction, such rhinitis are insufficient, then this patient could return for a dedicated CPAP titration study if appropriate.  Signed Baird Lyons M.D. Deneise Lever Diplomate, American Board of Sleep Medicine  ELECTRONICALLY SIGNED ON:  08/21/2013, 1:04 PM [place] PH: [phone]   FX: [phone] [place]

## 2013-08-24 ENCOUNTER — Ambulatory Visit (HOSPITAL_COMMUNITY)
Admission: RE | Admit: 2013-08-24 | Discharge: 2013-08-24 | Disposition: A | Discharge status: Home / Self Care | Payer: No Typology Code available for payment source | Source: Ambulatory Visit | Attending: Obstetrics & Gynecology | Admitting: Obstetrics & Gynecology

## 2013-08-24 DIAGNOSIS — D250 Submucous leiomyoma of uterus: Secondary | ICD-10-CM | POA: Insufficient documentation

## 2013-08-24 DIAGNOSIS — N852 Hypertrophy of uterus: Secondary | ICD-10-CM

## 2013-08-24 DIAGNOSIS — N920 Excessive and frequent menstruation with regular cycle: Secondary | ICD-10-CM | POA: Insufficient documentation

## 2013-08-26 ENCOUNTER — Ambulatory Visit (INDEPENDENT_AMBULATORY_CARE_PROVIDER_SITE_OTHER): Payer: No Typology Code available for payment source | Admitting: Family Medicine

## 2013-08-26 ENCOUNTER — Encounter: Payer: Self-pay | Admitting: Family Medicine

## 2013-08-26 ENCOUNTER — Other Ambulatory Visit (HOSPITAL_COMMUNITY)
Admission: RE | Admit: 2013-08-26 | Discharge: 2013-08-26 | Disposition: A | Discharge status: Home / Self Care | Payer: No Typology Code available for payment source | Source: Ambulatory Visit | Attending: Family Medicine | Admitting: Family Medicine

## 2013-08-26 VITALS — BP 127/76 | HR 95 | Temp 98.3°F | Wt 212.8 lb

## 2013-08-26 DIAGNOSIS — N924 Excessive bleeding in the premenopausal period: Secondary | ICD-10-CM

## 2013-08-26 MED ORDER — MEGESTROL ACETATE 40 MG PO TABS: 40.0000 mg | ORAL_TABLET | Freq: Three times a day (TID) | ORAL | Status: DC

## 2013-08-26 NOTE — Assessment & Plan Note (Signed)
Discussed with pt. Options of treating bleeding, related to fibroids, likelihood of success, risks of surgery, probably would need TVH.  She opts for conservative management at this time. Fibroid will shrink with menopause.  Not a good candidate for OC's given her HTN, elevated cholesterol.  Will trial Megace with cycles to diminish bleeding to a point where it is tolerable. Needs EMB.

## 2013-08-26 NOTE — Patient Instructions (Signed)
Fibroids Fibroids are lumps (tumors) that can occur any place in a woman's body. These lumps are not cancerous. Fibroids vary in size, weight, and where they grow. HOME CARE  Do not take aspirin.  Write down the number of pads or tampons you use during your period. Tell your doctor. This can help determine the best treatment for you. GET HELP RIGHT AWAY IF:  You have pain in your lower belly (abdomen) that is not helped with medicine.  You have cramps that are not helped with medicine.  You have more bleeding between or during your period.  You feel lightheaded or pass out (faint).  Your lower belly pain gets worse. MAKE SURE YOU:  Understand these instructions.  Will watch your condition.  Will get help right away if you are not doing well or get worse. Document Released: 03/09/2010 Document Revised: 04/29/2011 Document Reviewed: 03/09/2010 [facility] Patient Information 2015 [place], [place]. This information is not intended to replace advice given to you by your health care provider. Make sure you discuss any questions you have with your health care provider. Endometrial Biopsy Endometrial biopsy is a procedure in which a tissue sample is taken from inside the uterus. The tissue sample is then looked at under a microscope to see if the tissue is normal or abnormal. The endometrium is the lining of the uterus. This procedure helps determine where you are in your menstrual cycle and how hormone levels are affecting the lining of the uterus. This procedure may also be used to evaluate uterine bleeding or to diagnose endometrial cancer, tuberculosis, polyps, or inflammatory conditions.  LET [facility] CARE PROVIDER KNOW ABOUT:  Any allergies you have.  All medicines you are taking, including vitamins, herbs, eye drops, creams, and over-the-counter medicines.  Previous problems you or members of your family have had with the use of anesthetics.  Any blood disorders you  have.  Previous surgeries you have had.  Medical conditions you have.  Possibility of pregnancy. RISKS AND COMPLICATIONS Generally, this is a safe procedure. However, as with any procedure, complications can occur. Possible complications include:  Bleeding.  Pelvic infection.  Puncture of the uterine wall with the biopsy device (rare). BEFORE THE PROCEDURE   Keep a record of your menstrual cycles as directed by your health care provider. You may need to schedule your procedure for a specific time in your cycle.  You may want to bring a sanitary pad to wear home after the procedure.  Arrange for someone to drive you home after the procedure if you will be given a medicine to help you relax (sedative). PROCEDURE   You may be given a sedative to relax you.  You will lie on an exam table with your feet and legs supported as in a pelvic exam.  Your health care provider will insert an instrument (speculum) into your vagina to see your cervix.  Your cervix will be cleansed with an antiseptic solution. A medicine (local anesthetic) will be used to numb the cervix.  A forceps instrument (tenaculum) will be used to hold your cervix steady for the biopsy.  A thin, rodlike instrument (uterine sound) will be inserted through your cervix to determine the length of your uterus and the location where the biopsy sample will be removed.  A thin, flexible tube (catheter) will be inserted through your cervix and into the uterus. The catheter is used to collect the biopsy sample from your endometrial tissue.  The catheter and speculum will then be removed,  and the tissue sample will be sent to a lab for examination. AFTER THE PROCEDURE  You will rest in a recovery area until you are ready to go home.  You may have mild cramping and a small amount of vaginal bleeding for a few days after the procedure. This is normal.  Make sure you find out how to get your test results. Document Released:  06/07/2004 Document Revised: 10/07/2012 Document Reviewed: 07/22/2012 [facility] Patient Information 2015 [place], [place]. This information is not intended to replace advice given to you by your health care provider. Make sure you discuss any questions you have with your health care provider.

## 2013-08-26 NOTE — Progress Notes (Signed)
    Subjective:    Patient ID: Regina Crawford is a 48 y.o. female presenting with Follow-up  on 08/26/2013  HPI: Pt. Is here today for f/u results.  Has heavy menstrual bleeding and found to have 8 cm submucosal fibroid. Hgb was 10.5.  Normal TSH in 12/14.  Review of Systems  Constitutional: Positive for fatigue. Negative for fever and chills.  Respiratory: Negative for shortness of breath.   Cardiovascular: Negative for chest pain and leg swelling.      Objective:    BP 127/76  Pulse 95  Temp(Src) 98.3 F (36.8 C)  Wt 212 lb 12.8 oz (96.525 kg)  LMP 07/18/2013 Physical Exam  Constitutional: She appears well-developed.  Cardiovascular: Normal rate.   Pulmonary/Chest: Effort normal.  Abdominal: Soft.  Genitourinary: Vagina normal.    Procedure: Patient given informed consent, signed copy in the chart, time out was performed. Appropriate time out taken. . The patient was placed in the lithotomy position and the cervix brought into view with sterile speculum.  Portio of cervix cleansed x 2 with betadine swabs.  A tenaculum was placed in the anterior lip of the cervix.  The uterus was sounded for depth of 12 cm. A pipelle was introduced to into the uterus, suction created,  and an endometrial sample was obtained. A pass x 3 was done, since mostly blood was taken. All equipment was removed and accounted for.  The patient tolerated the procedure well.         Assessment & Plan:   Problem List Items Addressed This Visit     Medium   Heavy menstrual bleeding - Primary     Discussed with pt. Options of treating bleeding, related to fibroids, likelihood of success, risks of surgery, probably would need TVH.  She opts for conservative management at this time. Fibroid will shrink with menopause.  Not a good candidate for OC's given her HTN, elevated cholesterol.  Will trial Megace with cycles to diminish bleeding to a point where it is tolerable. Needs EMB.    Relevant Medications     megestrol (MEGACE) tablet   Other Relevant Orders      Surgical pathology

## 2013-08-27 ENCOUNTER — Other Ambulatory Visit (HOSPITAL_COMMUNITY): Payer: Self-pay

## 2013-10-12 ENCOUNTER — Encounter: Payer: Self-pay | Admitting: General Practice

## 2013-11-01 ENCOUNTER — Ambulatory Visit: Payer: No Typology Code available for payment source | Admitting: Internal Medicine

## 2013-11-03 ENCOUNTER — Encounter: Payer: Self-pay | Admitting: Internal Medicine

## 2013-11-03 ENCOUNTER — Ambulatory Visit: Payer: No Typology Code available for payment source | Attending: Internal Medicine | Admitting: Internal Medicine

## 2013-11-03 VITALS — BP 130/89 | HR 100 | Temp 98.9°F | Resp 16 | Wt 206.4 lb

## 2013-11-03 DIAGNOSIS — D649 Anemia, unspecified: Secondary | ICD-10-CM

## 2013-11-03 DIAGNOSIS — I10 Essential (primary) hypertension: Secondary | ICD-10-CM | POA: Insufficient documentation

## 2013-11-03 DIAGNOSIS — K029 Dental caries, unspecified: Secondary | ICD-10-CM | POA: Insufficient documentation

## 2013-11-03 DIAGNOSIS — Z23 Encounter for immunization: Secondary | ICD-10-CM | POA: Insufficient documentation

## 2013-11-03 DIAGNOSIS — J45909 Unspecified asthma, uncomplicated: Secondary | ICD-10-CM | POA: Insufficient documentation

## 2013-11-03 LAB — CBC WITH DIFFERENTIAL/PLATELET
Basophils Absolute: 0 10*3/uL (ref 0.0–0.1)
Basophils Relative: 0 % (ref 0–1)
EOS ABS: 0.1 10*3/uL (ref 0.0–0.7)
Eosinophils Relative: 2 % (ref 0–5)
HCT: 32.5 % — ABNORMAL LOW (ref 36.0–46.0)
Hemoglobin: 10.8 g/dL — ABNORMAL LOW (ref 12.0–15.0)
Lymphocytes Relative: 31 % (ref 12–46)
Lymphs Abs: 2 10*3/uL (ref 0.7–4.0)
MCH: 29.8 pg (ref 26.0–34.0)
MCHC: 33.2 g/dL (ref 30.0–36.0)
MCV: 89.8 fL (ref 78.0–100.0)
Monocytes Absolute: 0.5 10*3/uL (ref 0.1–1.0)
Monocytes Relative: 7 % (ref 3–12)
Neutro Abs: 3.9 10*3/uL (ref 1.7–7.7)
Neutrophils Relative %: 60 % (ref 43–77)
PLATELETS: 333 10*3/uL (ref 150–400)
RBC: 3.62 MIL/uL — AB (ref 3.87–5.11)
RDW: 14.5 % (ref 11.5–15.5)
WBC: 6.5 10*3/uL (ref 4.0–10.5)

## 2013-11-03 NOTE — Patient Instructions (Signed)
DASH Eating Plan °DASH stands for "Dietary Approaches to Stop Hypertension." The DASH eating plan is a healthy eating plan that has been shown to reduce high blood pressure (hypertension). Additional health benefits may include reducing the risk of type 2 diabetes mellitus, heart disease, and stroke. The DASH eating plan may also help with weight loss. °WHAT DO I NEED TO KNOW ABOUT THE DASH EATING PLAN? °For the DASH eating plan, you will follow these general guidelines: °· Choose foods with a percent daily value for sodium of less than 5% (as listed on the food label). °· Use salt-free seasonings or herbs instead of table salt or sea salt. °· Check with your health care provider or pharmacist before using salt substitutes. °· Eat lower-sodium products, often labeled as "lower sodium" or "no salt added." °· Eat fresh foods. °· Eat more vegetables, fruits, and low-fat dairy products. °· Choose whole grains. Look for the word "whole" as the first word in the ingredient list. °· Choose fish and skinless chicken or turkey more often than red meat. Limit fish, poultry, and meat to 6 oz (170 g) each day. °· Limit sweets, desserts, sugars, and sugary drinks. °· Choose heart-healthy fats. °· Limit cheese to 1 oz (28 g) per day. °· Eat more home-cooked food and less restaurant, buffet, and fast food. °· Limit fried foods. °· Cook foods using methods other than frying. °· Limit canned vegetables. If you do use them, rinse them well to decrease the sodium. °· When eating at a restaurant, ask that your food be prepared with less salt, or no salt if possible. °WHAT FOODS CAN I EAT? °Seek help from a dietitian for individual calorie needs. °Grains °Whole grain or whole wheat bread. Brown rice. Whole grain or whole wheat pasta. Quinoa, bulgur, and whole grain cereals. Low-sodium cereals. Corn or whole wheat flour tortillas. Whole grain cornbread. Whole grain crackers. Low-sodium crackers. °Vegetables °Fresh or frozen vegetables  (raw, steamed, roasted, or grilled). Low-sodium or reduced-sodium tomato and vegetable juices. Low-sodium or reduced-sodium tomato sauce and paste. Low-sodium or reduced-sodium canned vegetables.  °Fruits °All fresh, canned (in natural juice), or frozen fruits. °Meat and Other Protein Products °Ground beef (85% or leaner), grass-fed beef, or beef trimmed of fat. Skinless chicken or turkey. Ground chicken or turkey. Pork trimmed of fat. All fish and seafood. Eggs. Dried beans, peas, or lentils. Unsalted nuts and seeds. Unsalted canned beans. °Dairy °Low-fat dairy products, such as skim or 1% milk, 2% or reduced-fat cheeses, low-fat ricotta or cottage cheese, or plain low-fat yogurt. Low-sodium or reduced-sodium cheeses. °Fats and Oils °Tub margarines without trans fats. Light or reduced-fat mayonnaise and salad dressings (reduced sodium). Avocado. Safflower, olive, or canola oils. Natural peanut or almond butter. °Other °Unsalted popcorn and pretzels. °The items listed above may not be a complete list of recommended foods or beverages. Contact your dietitian for more options. °WHAT FOODS ARE NOT RECOMMENDED? °Grains °White bread. White pasta. White rice. Refined cornbread. Bagels and croissants. Crackers that contain trans fat. °Vegetables °Creamed or fried vegetables. Vegetables in a cheese sauce. Regular canned vegetables. Regular canned tomato sauce and paste. Regular tomato and vegetable juices. °Fruits °Dried fruits. Canned fruit in light or heavy syrup. Fruit juice. °Meat and Other Protein Products °Fatty cuts of meat. Ribs, chicken wings, bacon, sausage, bologna, salami, chitterlings, fatback, hot dogs, bratwurst, and packaged luncheon meats. Salted nuts and seeds. Canned beans with salt. °Dairy °Whole or 2% milk, cream, half-and-half, and cream cheese. Whole-fat or sweetened yogurt. Full-fat   cheeses or blue cheese. Nondairy creamers and whipped toppings. Processed cheese, cheese spreads, or cheese  curds. °Condiments °Onion and garlic salt, seasoned salt, table salt, and sea salt. Canned and packaged gravies. Worcestershire sauce. Tartar sauce. Barbecue sauce. Teriyaki sauce. Soy sauce, including reduced sodium. Steak sauce. Fish sauce. Oyster sauce. Cocktail sauce. Horseradish. Ketchup and mustard. Meat flavorings and tenderizers. Bouillon cubes. Hot sauce. Tabasco sauce. Marinades. Taco seasonings. Relishes. °Fats and Oils °Butter, stick margarine, lard, shortening, ghee, and bacon fat. Coconut, palm kernel, or palm oils. Regular salad dressings. °Other °Pickles and olives. Salted popcorn and pretzels. °The items listed above may not be a complete list of foods and beverages to avoid. Contact your dietitian for more information. °WHERE CAN I FIND MORE INFORMATION? °National Heart, Lung, and Blood Institute: www.nhlbi.nih.gov/health/health-topics/topics/dash/ °Document Released: 01/24/2011 Document Revised: 06/21/2013 Document Reviewed: 12/09/2012 °Exit[employer]® Patient Information ©2015 [employer]. This information is not intended to replace advice given to you by your health care provider. Make sure you discuss any questions you have with your health care provider. ° °

## 2013-11-03 NOTE — Progress Notes (Signed)
Patient here for follow up on her HTN and fibroids

## 2013-11-03 NOTE — Progress Notes (Signed)
MRN: [id number] Name: Regina Crawford  Sex: female Age: 48 y.o. DOB: 1965-12-23  Allergies: Review of Regina Crawford's allergies indicates no known allergies.  Chief Complaint  Regina Crawford presents with  . Follow-up    HPI: Regina Crawford is 48 y.o. female who has to of hypertension anemia secondary to fibroid and menorrhagia currently Regina Crawford following up with gynecologist, today her blood pressure is borderline elevated, denies any headache dizziness chest and shortness of breath, Regina Crawford is requesting to see a dentist since he has lot of dental cavities Regina Crawford also wants have a flu shot today.  Past Medical History  Diagnosis Date  . Asthma   . Hypertension     History reviewed. No pertinent past surgical history.    Medication List       This list is accurate as of: 11/03/13  5:52 PM.  Always use your most recent med list.               albuterol 108 (90 BASE) MCG/ACT inhaler  Commonly known as:  PROVENTIL HFA;VENTOLIN HFA  Inhale 2 puffs into the lungs every 6 (six) hours as needed for wheezing or shortness of breath.     budesonide-formoterol 80-4.5 MCG/ACT inhaler  Commonly known as:  SYMBICORT  Inhale 2 puffs into the lungs 2 (two) times daily.     ferrous sulfate 325 (65 FE) MG EC tablet  Take 1 tablet (325 mg total) by mouth 2 (two) times daily with a meal.     fluticasone 50 MCG/ACT nasal spray  Commonly known as:  FLONASE  Place 2 sprays into both nostrils daily.     hydrochlorothiazide 12.5 MG capsule  Commonly known as:  MICROZIDE  Take 1 capsule (12.5 mg total) by mouth 2 (two) times daily.     megestrol 40 MG tablet  Commonly known as:  MEGACE  Take 1 tablet (40 mg total) by mouth 3 (three) times daily. Take during period only     naproxen 500 MG tablet  Commonly known as:  NAPROSYN  Take 1 tablet (500 mg total) by mouth 2 (two) times daily as needed. Take with food. Use for knee pain     Vitamin D (Ergocalciferol) 50000 UNITS Caps capsule  Commonly known  as:  DRISDOL  Take 1 capsule (50,000 Units total) by mouth every 7 (seven) days.     WOMENS MULTIVITAMIN PLUS Tabs  Take 1 tablet by mouth daily.        No orders of the defined types were placed in this encounter.    Immunization History  Administered Date(s) Administered  . Influenza,inj,Quad PF,36+ Mos 11/03/2013  . Pneumococcal Polysaccharide-23 07/20/1998  . Td 06/19/2002    Family History  Problem Relation Age of Onset  . Diabetes Father   . Arthritis Sister   . Arthritis Daughter   . Arthritis Son   . Arthritis Paternal Aunt     History  Substance Use Topics  . Smoking status: Never Smoker   . Smokeless tobacco: Not on file  . Alcohol Use: Yes     Comment: occassional    Review of Systems   As noted in HPI  Filed Vitals:   11/03/13 1652  BP: 130/89  Pulse: 100  Temp: 98.9 F (37.2 C)  Resp: 16    Physical Exam  Physical Exam  Constitutional: No distress.  HENT:  Dental cavities   Eyes: EOM are normal. Pupils are equal, round, and reactive to light.  Cardiovascular: Normal rate and regular rhythm.  Pulmonary/Chest: Breath sounds normal. No respiratory distress. She has no wheezes. She has no rales.    CBC    Component Value Date/Time   WBC 4.6 08/09/2013 1649   RBC 3.47* 08/09/2013 1649   HGB 10.5* 08/09/2013 1649   HCT 31.6* 08/09/2013 1649   PLT 351 08/09/2013 1649   MCV 91.1 08/09/2013 1649   LYMPHSABS 1.7 02/16/2013 1506   MONOABS 0.3 02/16/2013 1506   EOSABS 0.2 02/16/2013 1506   BASOSABS 0.0 02/16/2013 1506    CMP     Component Value Date/Time   NA 138 02/16/2013 1506   K 3.9 02/16/2013 1506   CL 103 02/16/2013 1506   CO2 30 02/16/2013 1506   GLUCOSE 86 02/16/2013 1506   BUN 14 02/16/2013 1506   CREATININE 0.70 02/16/2013 1506   CREATININE 0.78 05/30/2009 1959   CALCIUM 8.7 02/16/2013 1506   PROT 6.8 02/16/2013 1506   ALBUMIN 4.0 02/16/2013 1506   AST 23 02/16/2013 1506   ALT 20 02/16/2013 1506   ALKPHOS 46 02/16/2013  1506   BILITOT 0.2* 02/16/2013 1506   GFRNONAA >89 02/16/2013 1506   GFRAA >89 02/16/2013 1506    Lab Results  Component Value Date/Time   CHOL 195 05/30/2009  7:59 PM    No components found with this basename: hga1c    Lab Results  Component Value Date/Time   AST 23 02/16/2013  3:06 PM    Assessment and Plan  Essential hypertension, benign - Plan: Blood pressure is borderline elevated, advise Regina Crawford for DASH diet, continue with hydrochlorothiazide, will repeat COMPLETE METABOLIC PANEL WITH GFR  Anemia, unspecified - Plan: Advised Regina Crawford to take iron supplements daily, will repeat CBC with Differential  Unspecified asthma(493.90) Currently Regina Crawford is on Symbicort and albuterol when necessary, symptoms are controlled.  Dental cavities - Plan: Ambulatory referral to Dentistry  Need for prophylactic vaccination and inoculation against influenza Given today.    Return in about 3 months (around 02/02/2014).  Lorayne Marek, MD

## 2013-11-04 LAB — COMPLETE METABOLIC PANEL WITH GFR
ALT: 13 U/L (ref 0–35)
AST: 18 U/L (ref 0–37)
Albumin: 4.3 g/dL (ref 3.5–5.2)
Alkaline Phosphatase: 50 U/L (ref 39–117)
BUN: 11 mg/dL (ref 6–23)
CALCIUM: 9.7 mg/dL (ref 8.4–10.5)
CO2: 28 mEq/L (ref 19–32)
CREATININE: 0.86 mg/dL (ref 0.50–1.10)
Chloride: 100 mEq/L (ref 96–112)
GFR, EST NON AFRICAN AMERICAN: 80 mL/min
GLUCOSE: 80 mg/dL (ref 70–99)
Potassium: 3.7 mEq/L (ref 3.5–5.3)
Sodium: 140 mEq/L (ref 135–145)
Total Bilirubin: 0.4 mg/dL (ref 0.2–1.2)
Total Protein: 7.2 g/dL (ref 6.0–8.3)

## 2013-11-08 ENCOUNTER — Encounter (HOSPITAL_COMMUNITY): Payer: Self-pay | Admitting: *Deleted

## 2013-11-08 ENCOUNTER — Inpatient Hospital Stay (HOSPITAL_COMMUNITY)
Admission: AD | Admit: 2013-11-08 | Discharge: 2013-11-08 | Disposition: A | Discharge status: Home / Self Care | Payer: No Typology Code available for payment source | Source: Ambulatory Visit | Attending: Family Medicine | Admitting: Family Medicine

## 2013-11-08 DIAGNOSIS — N949 Unspecified condition associated with female genital organs and menstrual cycle: Secondary | ICD-10-CM | POA: Insufficient documentation

## 2013-11-08 DIAGNOSIS — R109 Unspecified abdominal pain: Secondary | ICD-10-CM | POA: Insufficient documentation

## 2013-11-08 DIAGNOSIS — D259 Leiomyoma of uterus, unspecified: Secondary | ICD-10-CM | POA: Insufficient documentation

## 2013-11-08 DIAGNOSIS — N938 Other specified abnormal uterine and vaginal bleeding: Secondary | ICD-10-CM | POA: Insufficient documentation

## 2013-11-08 DIAGNOSIS — I10 Essential (primary) hypertension: Secondary | ICD-10-CM | POA: Insufficient documentation

## 2013-11-08 LAB — CBC
HCT: 27.7 % — ABNORMAL LOW (ref 36.0–46.0)
HEMOGLOBIN: 9 g/dL — AB (ref 12.0–15.0)
MCH: 30.1 pg (ref 26.0–34.0)
MCHC: 32.5 g/dL (ref 30.0–36.0)
MCV: 92.6 fL (ref 78.0–100.0)
Platelets: 322 10*3/uL (ref 150–400)
RBC: 2.99 MIL/uL — AB (ref 3.87–5.11)
RDW: 13.6 % (ref 11.5–15.5)
WBC: 8.5 10*3/uL (ref 4.0–10.5)

## 2013-11-08 LAB — URINALYSIS, ROUTINE W REFLEX MICROSCOPIC
Bilirubin Urine: NEGATIVE
Glucose, UA: 100 mg/dL — AB
Ketones, ur: 15 mg/dL — AB
Nitrite: POSITIVE — AB
Specific Gravity, Urine: 1.02 (ref 1.005–1.030)
UROBILINOGEN UA: 2 mg/dL — AB (ref 0.0–1.0)
pH: 5 (ref 5.0–8.0)

## 2013-11-08 LAB — URINE MICROSCOPIC-ADD ON

## 2013-11-08 LAB — POCT PREGNANCY, URINE: Preg Test, Ur: NEGATIVE

## 2013-11-08 MED ORDER — HYDROCODONE-ACETAMINOPHEN 5-325 MG PO TABS: 1.0000 | ORAL_TABLET | ORAL | Status: DC | PRN

## 2013-11-08 MED ORDER — MEDROXYPROGESTERONE ACETATE 10 MG PO TABS: 20.0000 mg | ORAL_TABLET | Freq: Every day | ORAL | Status: DC

## 2013-11-08 MED ORDER — KETOROLAC TROMETHAMINE 60 MG/2ML IM SOLN
60.0000 mg | Freq: Once | INTRAMUSCULAR | Status: AC
  Administered 2013-11-08: 60 mg via INTRAMUSCULAR
  Filled 2013-11-08: qty 2

## 2013-11-08 MED ORDER — HYDROCODONE-ACETAMINOPHEN 5-325 MG PO TABS
1.0000 | ORAL_TABLET | ORAL | Status: DC | PRN
Start: 2013-11-08 — End: 2014-02-10

## 2013-11-08 NOTE — MAU Provider Note (Signed)
History     CSN: [id number]  Arrival date and time: 11/08/13 1202   None     Chief Complaint  Patient presents with  . Abdominal Pain  . Vaginal Bleeding   HPI  Regina Crawford is a 49 y.o. female who presents with abdominal pain and vaginal bleeding. She has been seen in the clinic for the same complaint and has been on megace. She is scheduled to be seen in the clinic in Nov. For follow up. She has a history of uterine fibroids; last US showed an 8 cm fibroid. She has not been offered surgery, however feels at this point is the only option. She is taking 40 mg of megace three times per day. She states she is bleeding heavy at this time. She cannot count how many pads she has changed today.    OB History   Grav Para Term Preterm Abortions TAB SAB Ect Mult Living   4 2 2  0 2 0 2 0 0 2      Past Medical History  Diagnosis Date  . Asthma   . Hypertension     History reviewed. No pertinent past surgical history.  Family History  Problem Relation Age of Onset  . Diabetes Father   . Arthritis Sister   . Arthritis Daughter   . Arthritis Son   . Arthritis Paternal Aunt     History  Substance Use Topics  . Smoking status: Never Smoker   . Smokeless tobacco: Not on file  . Alcohol Use: Yes     Comment: occassional    Allergies: No Known Allergies  Prescriptions prior to admission  Medication Sig Dispense Refill  . albuterol (PROVENTIL HFA;VENTOLIN HFA) 108 (90 BASE) MCG/ACT inhaler Inhale 2 puffs into the lungs every 6 (six) hours as needed for wheezing or shortness of breath.  3 Inhaler  4  . budesonide-formoterol (SYMBICORT) 80-4.5 MCG/ACT inhaler Inhale 2 puffs into the lungs 2 (two) times daily.  3 Inhaler  3  . Cholecalciferol (VITAMIN D) 2000 UNITS CAPS Take 1 capsule by mouth daily.      . ferrous sulfate 325 (65 FE) MG EC tablet Take 1 tablet (325 mg total) by mouth 2 (two) times daily with a meal.  60 tablet  4  . hydrochlorothiazide (MICROZIDE) 12.5 MG  capsule Take 1 capsule (12.5 mg total) by mouth 2 (two) times daily.  60 capsule  3  . megestrol (MEGACE) 40 MG tablet Take 1 tablet (40 mg total) by mouth 3 (three) times daily. Take during period only  60 tablet  3  . Multiple Vitamins-Minerals (WOMENS MULTIVITAMIN PLUS) TABS Take 1 tablet by mouth daily.         Results for orders placed during the hospital encounter of 11/08/13 (from the past 48 hour(s))  URINALYSIS, ROUTINE W REFLEX MICROSCOPIC     Status: Abnormal   Collection Time    11/08/13 12:45 PM      Result Value Ref Range   Color, Urine RED (*) YELLOW   Comment: BIOCHEMICALS MAY BE AFFECTED BY COLOR   APPearance CLOUDY (*) CLEAR   Specific Gravity, Urine 1.020  1.005 - 1.030   pH 5.0  5.0 - 8.0   Glucose, UA 100 (*) NEGATIVE mg/dL   Hgb urine dipstick LARGE (*) NEGATIVE   Bilirubin Urine NEGATIVE  NEGATIVE   Ketones, ur 15 (*) NEGATIVE mg/dL   Protein, ur >300 (*) NEGATIVE mg/dL   Urobilinogen, UA 2.0 (*) 0.0 - 1.0 mg/dL  Nitrite POSITIVE (*) NEGATIVE   Leukocytes, UA MODERATE (*) NEGATIVE  URINE MICROSCOPIC-ADD ON     Status: None   Collection Time    11/08/13 12:45 PM      Result Value Ref Range   WBC, UA 0-2  <3 WBC/hpf   RBC / HPF TOO NUMEROUS TO COUNT  <3 RBC/hpf   Urine-Other MICROSCOPIC EXAM PERFORMED ON UNCONCENTRATED URINE    POCT PREGNANCY, URINE     Status: None   Collection Time    11/08/13 12:56 PM      Result Value Ref Range   Preg Test, Ur NEGATIVE  NEGATIVE   Comment:            THE SENSITIVITY OF THIS     METHODOLOGY IS >24 mIU/mL  CBC     Status: Abnormal   Collection Time    11/08/13  4:16 PM      Result Value Ref Range   WBC 8.5  4.0 - 10.5 K/uL   RBC 2.99 (*) 3.87 - 5.11 MIL/uL   Hemoglobin 9.0 (*) 12.0 - 15.0 g/dL   HCT 27.7 (*) 36.0 - 46.0 %   MCV 92.6  78.0 - 100.0 fL   MCH 30.1  26.0 - 34.0 pg   MCHC 32.5  30.0 - 36.0 g/dL   RDW 13.6  11.5 - 15.5 %   Platelets 322  150 - 400 K/uL    Review of Systems  Constitutional:  Positive for chills. Negative for fever.  Gastrointestinal: Positive for abdominal pain (Bilateral lower abdominal pain ). Negative for nausea and vomiting.  Genitourinary: Negative for dysuria, urgency, frequency and hematuria.       + heavy vaginal bleeding   Musculoskeletal: Positive for joint pain.  Neurological: Positive for headaches.   Physical Exam   Blood pressure 127/69, pulse 98, temperature 99 F (37.2 C), temperature source Oral, resp. rate 18, height 5' 1.5" (1.562 m), weight 94.348 kg (208 lb), last menstrual period 10/04/2013, SpO2 100.00%.  Physical Exam  Constitutional: She is oriented to person, place, and time. She appears well-developed and well-nourished. No distress.  HENT:  Head: Normocephalic.  Eyes: Pupils are equal, round, and reactive to light.  Neck: Neck supple.  Respiratory: Effort normal.  GI: Soft. Normal appearance. There is generalized tenderness.  Genitourinary:  Speculum exam: Vagina - Small amount of dark red blood in the vaginal canal  Cervix - + active bleeding from cervical os (small amount) Bimanual exam: Cervix closed Uterus with tenderness Adnexa non tender, no masses bilaterally, +suprapubic tenderness  Chaperone present for exam.   Musculoskeletal: Normal range of motion.  Neurological: She is alert and oriented to person, place, and time.  Skin: Skin is warm. She is not diaphoretic.  Psychiatric: Her behavior is normal.    MAU Course  Procedures None  MDM CBC US pelvic complete Toradol 60 IM; patient rates her pain 2/10 following injection Patient has been waiting in MAU several hours. I discussed having an out patient US done with Dr. Nehemiah Settle who agrees that the patient is stable to have the US done outpatient > patient agrees.  I will dc megace and switch to provera. Vicodin RX given  Urine culture pending.     A: 1. Leiomyoma of uterus, unspecified   2. Abdominal pain in female    P: Discharge home in stable  condition Pelvic US ordered; [place] will call to schedule Patient to follow up with the [facility]; message sent RX: Vicodin, Provera (20mg   PO for 10 days) Bleeding precautions Return to MAU as needed, if symptoms worsen  Iron daily as directed on the bottle    Darrelyn Hillock Rasch, NP  11/08/2013, 4:14 PM

## 2013-11-08 NOTE — MAU Note (Signed)
Patient states she had been having periods twice a month for about the past six months. Started bleeding about one month ago and has been bleeding every day, some heavy. Has had abdominal pain in the lower abdomen for about one week.

## 2013-11-08 NOTE — MAU Note (Signed)
Patient states she was diagnosed with fibroids about 2 months ago. Cannot get an appointment until November and can not wait. Was given Megace but states it did not work.

## 2013-11-09 LAB — URINE CULTURE
Colony Count: 40000
SPECIAL REQUESTS: NORMAL

## 2013-11-10 ENCOUNTER — Other Ambulatory Visit (HOSPITAL_COMMUNITY): Payer: Self-pay | Admitting: Obstetrics and Gynecology

## 2013-11-10 ENCOUNTER — Ambulatory Visit (HOSPITAL_COMMUNITY)
Admission: RE | Admit: 2013-11-10 | Discharge: 2013-11-10 | Disposition: A | Discharge status: Home / Self Care | Payer: No Typology Code available for payment source | Source: Ambulatory Visit | Attending: Obstetrics and Gynecology | Admitting: Obstetrics and Gynecology

## 2013-11-10 DIAGNOSIS — D250 Submucous leiomyoma of uterus: Secondary | ICD-10-CM | POA: Insufficient documentation

## 2013-11-10 DIAGNOSIS — D259 Leiomyoma of uterus, unspecified: Secondary | ICD-10-CM

## 2013-11-10 DIAGNOSIS — D252 Subserosal leiomyoma of uterus: Secondary | ICD-10-CM | POA: Insufficient documentation

## 2013-11-10 DIAGNOSIS — R109 Unspecified abdominal pain: Secondary | ICD-10-CM

## 2013-11-10 DIAGNOSIS — N938 Other specified abnormal uterine and vaginal bleeding: Secondary | ICD-10-CM | POA: Insufficient documentation

## 2013-11-10 DIAGNOSIS — N949 Unspecified condition associated with female genital organs and menstrual cycle: Secondary | ICD-10-CM | POA: Insufficient documentation

## 2013-11-10 NOTE — MAU Provider Note (Signed)
Attestation of Attending Supervision of Advanced Practitioner (PA/CNM/NP): Evaluation and management procedures were performed by the Advanced Practitioner under my supervision and collaboration.  I have reviewed the Advanced Practitioner's note and chart, and I agree with the management and plan.  Keyana Guevara, DO Attending Physician Faculty Practice, [facility]  

## 2013-11-16 ENCOUNTER — Inpatient Hospital Stay (HOSPITAL_COMMUNITY)
Admission: AD | Admit: 2013-11-16 | Discharge: 2013-11-16 | Disposition: A | Discharge status: Home / Self Care | Payer: No Typology Code available for payment source | Source: Ambulatory Visit | Attending: Obstetrics and Gynecology | Admitting: Obstetrics and Gynecology

## 2013-11-16 DIAGNOSIS — N949 Unspecified condition associated with female genital organs and menstrual cycle: Secondary | ICD-10-CM | POA: Insufficient documentation

## 2013-11-16 DIAGNOSIS — D250 Submucous leiomyoma of uterus: Secondary | ICD-10-CM

## 2013-11-16 DIAGNOSIS — D259 Leiomyoma of uterus, unspecified: Secondary | ICD-10-CM | POA: Insufficient documentation

## 2013-11-16 DIAGNOSIS — N938 Other specified abnormal uterine and vaginal bleeding: Secondary | ICD-10-CM | POA: Insufficient documentation

## 2013-11-16 NOTE — MAU Provider Note (Signed)
Thought she was supposed to come back for follow up in MAU today to discuss surgical options for fibroids/heavy bleeding. Light bleeding today. No pain or fevers. Has F/U appt in clinic scheduled 11/2. Declines exam today.

## 2013-11-16 NOTE — MAU Note (Signed)
Was told to come back in a wk.  Has had [place] since she was here. Has appt at [facility] Nov 2.  States is feeling better, bleeding less.

## 2013-11-21 NOTE — MAU Provider Note (Signed)
Attestation of Attending Supervision of Advanced Practitioner (CNM/NP): Evaluation and management procedures were performed by the Advanced Practitioner under my supervision and collaboration.  I have reviewed the Advanced Practitioner's note and chart, and I agree with the management and plan.  Michaiah Holsopple 11/21/2013 10:40 AM

## 2013-12-13 ENCOUNTER — Telehealth: Payer: Self-pay | Admitting: Internal Medicine

## 2013-12-13 ENCOUNTER — Telehealth: Payer: Self-pay | Admitting: Emergency Medicine

## 2013-12-13 NOTE — Telephone Encounter (Signed)
Left message for pt to call when message received 

## 2013-12-13 NOTE — Telephone Encounter (Signed)
Pt has question regarding change in blood pressure medication directions. Please f/u with pt.

## 2013-12-20 ENCOUNTER — Ambulatory Visit (INDEPENDENT_AMBULATORY_CARE_PROVIDER_SITE_OTHER): Payer: Self-pay | Admitting: Obstetrics and Gynecology

## 2013-12-20 ENCOUNTER — Ambulatory Visit: Payer: No Typology Code available for payment source | Admitting: Nurse Practitioner

## 2013-12-20 ENCOUNTER — Encounter (HOSPITAL_COMMUNITY): Payer: Self-pay | Admitting: *Deleted

## 2013-12-20 VITALS — BP 156/88 | HR 82 | Temp 98.7°F | Wt 214.1 lb

## 2013-12-20 DIAGNOSIS — Z1151 Encounter for screening for human papillomavirus (HPV): Secondary | ICD-10-CM

## 2013-12-20 DIAGNOSIS — D500 Iron deficiency anemia secondary to blood loss (chronic): Secondary | ICD-10-CM

## 2013-12-20 DIAGNOSIS — Z124 Encounter for screening for malignant neoplasm of cervix: Secondary | ICD-10-CM

## 2013-12-20 DIAGNOSIS — D250 Submucous leiomyoma of uterus: Secondary | ICD-10-CM

## 2013-12-20 MED ORDER — FERROUS SULFATE 325 (65 FE) MG PO TBEC: 325.0000 mg | DELAYED_RELEASE_TABLET | Freq: Two times a day (BID) | ORAL | Status: DC

## 2013-12-20 MED ORDER — MEDROXYPROGESTERONE ACETATE 10 MG PO TABS: 20.0000 mg | ORAL_TABLET | Freq: Every day | ORAL | Status: DC

## 2013-12-20 NOTE — Progress Notes (Signed)
Patient ID: Regina Crawford, female   DOB: Jul 29, 1965, 48 y.o.   MRN: [id number] 48 yo [id number] presenting today for follow up on fibroid uterus and dysfunctional uterine bleeding. Patient was last seen in July 2015 where a benign endometrial biopsy was collected. Patient opted for conservative management of her bleeding with megace. Patient reports since July daily vaginal bleeding at times heavy with passage of clots. Patient also reports [name] pelvic pressure and pain. Patient is ready for surgical intervention at this time  Past Medical History  Diagnosis Date  . Asthma   . Hypertension    History reviewed. No pertinent past surgical history. Family History  Problem Relation Age of Onset  . Diabetes Father   . Arthritis Sister   . Arthritis Daughter   . Arthritis Son   . Arthritis Paternal Aunt    History  Substance Use Topics  . Smoking status: Never Smoker   . Smokeless tobacco: Never Used  . Alcohol Use: Yes     Comment: occassional   GENERAL: Well-developed, well-nourished female in no acute distress.  ABDOMEN: Soft, nontender, nondistended. No organomegaly. PELVIC: Normal external female genitalia. Vagina is pink and rugated.  Normal discharge. Normal appearing cervix. Uterus is 18-week size. No adnexal mass or tenderness. EXTREMITIES: No cyanosis, clubbing, or edema, 2+ distal pulses.  08/2013 EMBx Endometrium, biopsy - PROLIFERATIVE ENDOMETRIUM WITH FOCAL BREAKDOWN. NO HYPERPLASIA OR CARCINOMA  10/2013 Ultrasound FINDINGS: Uterus  Measurements: 17 x 11 x 11.3. Multiple fibroids identified. Large submucosal fibroid is identified within the central uterine cavity measuring 6.9 x 8.0 x 6.9 cm. There is a subserosal fibroid arising from the posterior fundus measuring 4 x 3.9 x 4.5 cm. Within the right posterior lower uterine segment there is a [name] would measuring 3.2 x 3.8 x 3.2 cm.  Endometrium  Thickness: The endometrium is difficult to assess due to  large submucosal fibroid.  Right ovary  Measurements: Not visualized. No adnexal mass seen.  Left ovary  Measurements: Not visualized. No adnexal mass seen.  Other findings: No free fluid  IMPRESSION: 1. Enlarged fibroid uterus. 2. Large submucosal fibroid is identified within the central portion of the endometrial cavity. This may account for the patient's heavy bleeding. Consider further evaluation with sonohysterogram for confirmation prior to hysteroscopy. Endometrial sampling should also be considered if patient is at high risk for endometrial carcinoma. (Ref: Radiological Reasoning: Algorithmic Workup of Abnormal Vaginal Bleeding with Endovaginal Sonography and Sonohysterography. AJR 2008; 852:D78-24)  A/P 48 yo with symptomatic fibroid uterus - pap smear collected - Discussed risks, benefits and alternatives of TAH with bilateral salpingectomy including but not limited to risks of bleeding, infection and damage to adjacent organs. Patient verbalized understanding and all questions were answered. - patient will be scheduled for a TAH with bilateral salpingectomy

## 2013-12-20 NOTE — Addendum Note (Signed)
Addended by: Shelly Coss on: 12/20/2013 04:36 PM   Modules accepted: Orders

## 2013-12-23 LAB — CYTOLOGY - PAP

## 2014-01-20 ENCOUNTER — Other Ambulatory Visit: Payer: Self-pay | Admitting: Internal Medicine

## 2014-01-31 NOTE — Patient Instructions (Addendum)
   Your procedure is scheduled on: Tuesday, Dec 22  Enter through the [employer] [facility] at: 11:30 AM Pick up the phone at the desk and dial [phone] and inform us of your arrival.  Please call this number if you have any problems the morning of surgery: [phone]  Remember: Do not eat food after midnight: Monday Do not drink clear liquids after: 9 AM Tuesday, day of surgery Take these medicines the morning of surgery with a SIP OF WATER:  HCTZ and use symbicort inhaler.  Bring albuterol inhaler with you on day of surgery.  Do not wear jewelry, make-up, or FINGER nail polish No metal in your hair or on your body. Do not wear lotions, powders, perfumes.  You may wear deodorant.  Do not bring valuables to the hospital. Contacts, dentures or bridgework may not be worn into surgery.  Leave suitcase in the car. After Surgery it may be brought to your room. For patients being admitted to the hospital, checkout time is 11:00am the day of discharge.  Home with husband [place] cell [phone]

## 2014-02-01 ENCOUNTER — Encounter (HOSPITAL_COMMUNITY)
Admission: RE | Admit: 2014-02-01 | Discharge: 2014-02-01 | Disposition: A | Discharge status: Home / Self Care | Payer: No Typology Code available for payment source | Source: Ambulatory Visit | Attending: Obstetrics and Gynecology | Admitting: Obstetrics and Gynecology

## 2014-02-01 ENCOUNTER — Telehealth: Payer: Self-pay | Admitting: Internal Medicine

## 2014-02-01 ENCOUNTER — Other Ambulatory Visit: Payer: Self-pay

## 2014-02-01 ENCOUNTER — Encounter (HOSPITAL_COMMUNITY): Payer: Self-pay

## 2014-02-01 DIAGNOSIS — Z01812 Encounter for preprocedural laboratory examination: Secondary | ICD-10-CM | POA: Insufficient documentation

## 2014-02-01 HISTORY — DX: Sleep apnea, unspecified: G47.30

## 2014-02-01 HISTORY — DX: Anemia, unspecified: D64.9

## 2014-02-01 HISTORY — DX: Hyperlipidemia, unspecified: E78.5

## 2014-02-01 LAB — CBC
HEMATOCRIT: 35.5 % — AB (ref 36.0–46.0)
Hemoglobin: 11.4 g/dL — ABNORMAL LOW (ref 12.0–15.0)
MCH: 29.9 pg (ref 26.0–34.0)
MCHC: 32.1 g/dL (ref 30.0–36.0)
MCV: 93.2 fL (ref 78.0–100.0)
Platelets: 247 10*3/uL (ref 150–400)
RBC: 3.81 MIL/uL — AB (ref 3.87–5.11)
RDW: 13.5 % (ref 11.5–15.5)
WBC: 3.7 10*3/uL — ABNORMAL LOW (ref 4.0–10.5)

## 2014-02-01 LAB — BASIC METABOLIC PANEL
Anion gap: 11 (ref 5–15)
BUN: 7 mg/dL (ref 6–23)
CO2: 26 mEq/L (ref 19–32)
Calcium: 9.4 mg/dL (ref 8.4–10.5)
Chloride: 102 mEq/L (ref 96–112)
Creatinine, Ser: 0.75 mg/dL (ref 0.50–1.10)
GFR calc Af Amer: >90 mL/min (ref >90)
GLUCOSE: 98 mg/dL (ref 70–99)
Potassium: 3.6 mEq/L — ABNORMAL LOW (ref 3.7–5.3)
Sodium: 139 mEq/L (ref 137–147)

## 2014-02-01 NOTE — Telephone Encounter (Signed)
Pt. Came into facility to request a refill for hydrochlorothiazide (MICROZIDE) 12.5 MG capsule, pt. Ran out of medication last week and would like to get enough to last her until next OV with PCP. Please f/u with pt.

## 2014-02-02 ENCOUNTER — Other Ambulatory Visit: Payer: Self-pay | Admitting: Internal Medicine

## 2014-02-02 NOTE — Telephone Encounter (Signed)
Pt. Came into facility to request a refill for hydrochlorothiazide (MICROZIDE) 12.5 MG capsule, pt. Ran out of medication last week and would like to get enough to last her until next OV with PCP. Please f/u with pt.

## 2014-02-03 NOTE — Telephone Encounter (Signed)
Pt calling to follow up on bp med refill request.  Pt says she is having a surgery on Tuesday, 02/08/14, and needs refill before then. Please f/u with pt.

## 2014-02-04 NOTE — Telephone Encounter (Signed)
Returned patient call on 02/04/2014 at 10:50 AM. No answer. Left a voicemail for patient to return call. Vivia Birmingham, RN

## 2014-02-07 NOTE — H&P (Signed)
Regina Crawford is an 48 y.o. female G4P2 with symptomatic fibroid uterus presenting today for definitive management with hysterectomy. Patient has had abnormal uterine bleeding for several months which failed medical management and has now developed pelvic pain and pressure.  Pertinent Gynecological History: Menses: abnormal uterine bleeding Bleeding: dysfunctional uterine bleeding Contraception: none DES exposure: denies Blood transfusions: none Sexually transmitted diseases: no past history Previous GYN Procedures: DNC  Last mammogram: normal Date: 03/2013 Last pap: normal Date: 12/2013 OB History: G4, P2022   Menstrual History: No LMP recorded.    Past Medical History  Diagnosis Date  . Asthma   . Hypertension   . Anemia   . SVD (spontaneous vaginal delivery)     x 2 ( one in [place] and one at [facility])  . Hyperlipidemia     diet controlled - no med  . Sleep apnea     does not use CPAP every night    Past Surgical History  Procedure Laterality Date  . Wisdom tooth extraction      Family History  Problem Relation Age of Onset  . Diabetes Father   . Arthritis Sister   . Arthritis Daughter   . Arthritis Son   . Arthritis Paternal Aunt     Social History:  reports that she has never smoked. She has never used smokeless tobacco. She reports that she drinks alcohol. She reports that she does not use illicit drugs.  Allergies: No Known Allergies  Prescriptions prior to admission  Medication Sig Dispense Refill Last Dose  . albuterol (PROVENTIL HFA;VENTOLIN HFA) 108 (90 BASE) MCG/ACT inhaler Inhale 2 puffs into the lungs every 6 (six) hours as needed for wheezing or shortness of breath. 3 Inhaler 4 Taking  . budesonide-formoterol (SYMBICORT) 80-4.5 MCG/ACT inhaler Inhale 2 puffs into the lungs 2 (two) times daily. 3 Inhaler 3 Taking  . Cholecalciferol (VITAMIN D) 2000 UNITS CAPS Take 1 capsule by mouth daily.   Taking  . ferrous sulfate 325 (65 FE) MG EC tablet Take 1  tablet (325 mg total) by mouth 2 (two) times daily with a meal. 60 tablet 4   . hydrochlorothiazide (MICROZIDE) 12.5 MG capsule Take 1 capsule (12.5 mg total) by mouth 2 (two) times daily. (Patient taking differently: Take 12.5 mg by mouth daily. ) 60 capsule 3 02/08/2014 at 0900  . Multiple Vitamins-Minerals (WOMENS MULTIVITAMIN PLUS) TABS Take 1 tablet by mouth daily.     Taking  . naproxen sodium (ALEVE) 220 MG tablet Take 440 mg by mouth 2 (two) times daily as needed (pain).     Marland Kitchen HYDROcodone-acetaminophen (NORCO/VICODIN) 5-325 MG per tablet Take 1-2 tablets by mouth every 4 (four) hours as needed for moderate pain or severe pain. (Patient not taking: Reported on 01/27/2014) 15 tablet 0 Not Taking  . medroxyPROGESTERone (PROVERA) 10 MG tablet Take 2 tablets (20 mg total) by mouth daily. (Patient not taking: Reported on 01/27/2014) 30 tablet 2     Review of Systems  All other systems reviewed and are negative.   Blood pressure 137/81. Physical Exam GENERAL: Well-developed, well-nourished female in no acute distress.  HEENT: Normocephalic, atraumatic. Sclerae anicteric.  NECK: Supple. Normal thyroid.  LUNGS: Clear to auscultation bilaterally.  HEART: Regular rate and rhythm. ABDOMEN: Soft, nontender, nondistended. No organomegaly. PELVIC: Deferred to OR EXTREMITIES: No cyanosis, clubbing, or edema, 2+ distal pulses.  No results found for this or any previous visit (from the past 24 hour(s)).  No results found.  08/2013 EMBx Endometrium, biopsy -  PROLIFERATIVE ENDOMETRIUM WITH FOCAL BREAKDOWN. NO HYPERPLASIA OR CARCINOMA  10/2013 Ultrasound FINDINGS: Uterus  Measurements: 17 x 11 x 11.3. Multiple fibroids identified. Large submucosal fibroid is identified within the central uterine cavity measuring 6.9 x 8.0 x 6.9 cm. There is a subserosal fibroid arising from the posterior fundus measuring 4 x 3.9 x 4.5 cm. Within the right posterior lower uterine segment there is a [name]  would measuring 3.2 x 3.8 x 3.2 cm.  Endometrium  Thickness: The endometrium is difficult to assess due to large submucosal fibroid.  Right ovary  Measurements: Not visualized. No adnexal mass seen.  Left ovary  Measurements: Not visualized. No adnexal mass seen.  Other findings: No free fluid  IMPRESSION: 1. Enlarged fibroid uterus. 2. Large submucosal fibroid is identified within the central portion of the endometrial cavity. This may account for the patient's heavy bleeding. Consider further evaluation with sonohysterogram for confirmation prior to hysteroscopy. Endometrial sampling should also be considered if patient is at high risk for endometrial carcinoma. (Ref: Radiological Reasoning: Algorithmic Workup of Abnormal Vaginal Bleeding with Endovaginal Sonography and Sonohysterography. AJR 2008; 597:C16-38)  Assessment/Plan: 48 yo [id number] with symptomatic fibroid uterus who is here for definitive management with hysterectomy - Risks, benefits and alternatives were explained including but not limited to risks of bleeding, infection and damage to adjacent organs. Patient verbalized understanding and all questions were answered. Patient is consented for abdominal hysterectomy with bilateral salpingectomy  Esteen Delpriore 02/08/2014, 11:35 AM

## 2014-02-08 ENCOUNTER — Encounter (HOSPITAL_COMMUNITY)
Admission: RE | Disposition: A | Discharge status: Home / Self Care | Payer: Self-pay | Source: Ambulatory Visit | Attending: Obstetrics and Gynecology

## 2014-02-08 ENCOUNTER — Encounter (HOSPITAL_COMMUNITY): Payer: Self-pay | Admitting: Anesthesiology

## 2014-02-08 ENCOUNTER — Inpatient Hospital Stay (HOSPITAL_COMMUNITY): Payer: Self-pay | Admitting: Certified Registered Nurse Anesthetist

## 2014-02-08 ENCOUNTER — Inpatient Hospital Stay (HOSPITAL_COMMUNITY): Payer: No Typology Code available for payment source | Admitting: Certified Registered Nurse Anesthetist

## 2014-02-08 ENCOUNTER — Inpatient Hospital Stay (HOSPITAL_COMMUNITY)
Admission: RE | Admit: 2014-02-08 | Discharge: 2014-02-10 | DRG: 743 | Disposition: A | Discharge status: Home / Self Care | Payer: Self-pay | Source: Ambulatory Visit | Attending: Obstetrics and Gynecology | Admitting: Obstetrics and Gynecology

## 2014-02-08 DIAGNOSIS — D252 Subserosal leiomyoma of uterus: Secondary | ICD-10-CM | POA: Diagnosis present

## 2014-02-08 DIAGNOSIS — I10 Essential (primary) hypertension: Secondary | ICD-10-CM | POA: Diagnosis present

## 2014-02-08 DIAGNOSIS — G4730 Sleep apnea, unspecified: Secondary | ICD-10-CM | POA: Diagnosis present

## 2014-02-08 DIAGNOSIS — D250 Submucous leiomyoma of uterus: Principal | ICD-10-CM | POA: Diagnosis present

## 2014-02-08 DIAGNOSIS — N939 Abnormal uterine and vaginal bleeding, unspecified: Secondary | ICD-10-CM

## 2014-02-08 DIAGNOSIS — Z8261 Family history of arthritis: Secondary | ICD-10-CM

## 2014-02-08 DIAGNOSIS — Z833 Family history of diabetes mellitus: Secondary | ICD-10-CM

## 2014-02-08 DIAGNOSIS — Z90710 Acquired absence of both cervix and uterus: Secondary | ICD-10-CM | POA: Diagnosis present

## 2014-02-08 DIAGNOSIS — D251 Intramural leiomyoma of uterus: Secondary | ICD-10-CM | POA: Diagnosis present

## 2014-02-08 DIAGNOSIS — N920 Excessive and frequent menstruation with regular cycle: Secondary | ICD-10-CM | POA: Diagnosis present

## 2014-02-08 DIAGNOSIS — E785 Hyperlipidemia, unspecified: Secondary | ICD-10-CM | POA: Diagnosis present

## 2014-02-08 DIAGNOSIS — J45909 Unspecified asthma, uncomplicated: Secondary | ICD-10-CM | POA: Diagnosis present

## 2014-02-08 HISTORY — PX: BILATERAL SALPINGECTOMY: SHX5743

## 2014-02-08 HISTORY — PX: ABDOMINAL HYSTERECTOMY: SHX81

## 2014-02-08 SURGERY — HYSTERECTOMY, ABDOMINAL
Anesthesia: General | Site: Abdomen

## 2014-02-08 MED ORDER — GLYCOPYRROLATE 0.2 MG/ML IJ SOLN
INTRAMUSCULAR | Status: AC
  Filled 2014-02-08: qty 3

## 2014-02-08 MED ORDER — SCOPOLAMINE 1 MG/3DAYS TD PT72
1.0000 | MEDICATED_PATCH | Freq: Once | TRANSDERMAL | Status: AC
  Administered 2014-02-08: 1 via TRANSDERMAL
  Administered 2014-02-08: 1.5 mg via TRANSDERMAL

## 2014-02-08 MED ORDER — BUPIVACAINE HCL (PF) 0.25 % IJ SOLN
INTRAMUSCULAR | Status: AC
  Filled 2014-02-08: qty 30

## 2014-02-08 MED ORDER — LIDOCAINE HCL (CARDIAC) 20 MG/ML IV SOLN
INTRAVENOUS | Status: DC | PRN
  Administered 2014-02-08: 80 mg via INTRAVENOUS

## 2014-02-08 MED ORDER — NEOSTIGMINE METHYLSULFATE 10 MG/10ML IV SOLN
INTRAVENOUS | Status: AC
Start: 2014-02-08 — End: 2014-02-08
  Filled 2014-02-08: qty 1

## 2014-02-08 MED ORDER — DEXAMETHASONE SODIUM PHOSPHATE 10 MG/ML IJ SOLN
INTRAMUSCULAR | Status: DC | PRN
  Administered 2014-02-08: 10 mg via INTRAVENOUS

## 2014-02-08 MED ORDER — LACTATED RINGERS IV SOLN
INTRAVENOUS | Status: DC
  Administered 2014-02-08 (×3): via INTRAVENOUS

## 2014-02-08 MED ORDER — FENTANYL CITRATE 0.05 MG/ML IJ SOLN
INTRAMUSCULAR | Status: DC | PRN
  Administered 2014-02-08: 100 ug via INTRAVENOUS
  Administered 2014-02-08 (×3): 50 ug via INTRAVENOUS

## 2014-02-08 MED ORDER — HYDROMORPHONE HCL 1 MG/ML IJ SOLN
INTRAMUSCULAR | Status: AC
  Filled 2014-02-08: qty 1

## 2014-02-08 MED ORDER — ONDANSETRON HCL 4 MG/2ML IJ SOLN
4.0000 mg | Freq: Four times a day (QID) | INTRAMUSCULAR | Status: DC | PRN
Start: 2014-02-08 — End: 2014-02-09

## 2014-02-08 MED ORDER — PROPOFOL 10 MG/ML IV EMUL
INTRAVENOUS | Status: AC
  Filled 2014-02-08: qty 20

## 2014-02-08 MED ORDER — ONDANSETRON HCL 4 MG/2ML IJ SOLN
INTRAMUSCULAR | Status: DC | PRN
  Administered 2014-02-08 (×2): 2 mg via INTRAVENOUS

## 2014-02-08 MED ORDER — BUDESONIDE-FORMOTEROL FUMARATE 80-4.5 MCG/ACT IN AERO
2.0000 | INHALATION_SPRAY | Freq: Two times a day (BID) | RESPIRATORY_TRACT | Status: DC
  Administered 2014-02-08 – 2014-02-10 (×4): 2 via RESPIRATORY_TRACT
  Filled 2014-02-08: qty 6.9

## 2014-02-08 MED ORDER — HYDROCHLOROTHIAZIDE 12.5 MG PO CAPS
12.5000 mg | ORAL_CAPSULE | Freq: Once | ORAL | Status: AC
  Administered 2014-02-08: 12.5 mg via ORAL
  Filled 2014-02-08: qty 1

## 2014-02-08 MED ORDER — CEFAZOLIN SODIUM-DEXTROSE 2-3 GM-% IV SOLR
INTRAVENOUS | Status: AC
  Filled 2014-02-08: qty 50

## 2014-02-08 MED ORDER — ROCURONIUM BROMIDE 100 MG/10ML IV SOLN
INTRAVENOUS | Status: DC | PRN
  Administered 2014-02-08: 60 mg via INTRAVENOUS
  Administered 2014-02-08: 10 mg via INTRAVENOUS

## 2014-02-08 MED ORDER — HYDROCHLOROTHIAZIDE 12.5 MG PO CAPS
12.5000 mg | ORAL_CAPSULE | Freq: Two times a day (BID) | ORAL | Status: DC
  Administered 2014-02-08 – 2014-02-10 (×4): 12.5 mg via ORAL
  Filled 2014-02-08 (×4): qty 1

## 2014-02-08 MED ORDER — NALOXONE HCL 0.4 MG/ML IJ SOLN: 0.4000 mg | INTRAMUSCULAR | Status: DC | PRN

## 2014-02-08 MED ORDER — MIDAZOLAM HCL 2 MG/2ML IJ SOLN
INTRAMUSCULAR | Status: AC
  Filled 2014-02-08: qty 2

## 2014-02-08 MED ORDER — PROPOFOL 10 MG/ML IV BOLUS
INTRAVENOUS | Status: DC | PRN
  Administered 2014-02-08: 150 mg via INTRAVENOUS
  Administered 2014-02-08: 50 mg via INTRAVENOUS

## 2014-02-08 MED ORDER — DIPHENHYDRAMINE HCL 12.5 MG/5ML PO ELIX: 12.5000 mg | ORAL_SOLUTION | Freq: Four times a day (QID) | ORAL | Status: DC | PRN

## 2014-02-08 MED ORDER — LIDOCAINE HCL (CARDIAC) 20 MG/ML IV SOLN
INTRAVENOUS | Status: AC
  Filled 2014-02-08: qty 5

## 2014-02-08 MED ORDER — FENTANYL CITRATE 0.05 MG/ML IJ SOLN
INTRAMUSCULAR | Status: AC
  Filled 2014-02-08: qty 5

## 2014-02-08 MED ORDER — NEOSTIGMINE METHYLSULFATE 10 MG/10ML IV SOLN
INTRAVENOUS | Status: DC | PRN
  Administered 2014-02-08: 5 mg via INTRAVENOUS

## 2014-02-08 MED ORDER — SCOPOLAMINE 1 MG/3DAYS TD PT72
MEDICATED_PATCH | TRANSDERMAL | Status: AC
  Administered 2014-02-08: 1.5 mg via TRANSDERMAL
  Filled 2014-02-08: qty 1

## 2014-02-08 MED ORDER — HYDROMORPHONE 0.3 MG/ML IV SOLN
INTRAVENOUS | Status: DC
  Administered 2014-02-08: 0.3 mg via INTRAVENOUS
  Administered 2014-02-08: 19:00:00 via INTRAVENOUS
  Administered 2014-02-09: 0.3 mg via INTRAVENOUS
  Administered 2014-02-09: 0.6 mg via INTRAVENOUS
  Filled 2014-02-08: qty 25

## 2014-02-08 MED ORDER — MIDAZOLAM HCL 2 MG/2ML IJ SOLN
INTRAMUSCULAR | Status: DC | PRN
  Administered 2014-02-08: 1.5 mg via INTRAVENOUS
  Administered 2014-02-08: 0.5 mg via INTRAVENOUS

## 2014-02-08 MED ORDER — CEFAZOLIN SODIUM-DEXTROSE 2-3 GM-% IV SOLR
2.0000 g | INTRAVENOUS | Status: AC
  Administered 2014-02-08: 2 g via INTRAVENOUS

## 2014-02-08 MED ORDER — HYDROMORPHONE HCL 1 MG/ML IJ SOLN
INTRAMUSCULAR | Status: DC | PRN
  Administered 2014-02-08: 2 mg via INTRAVENOUS

## 2014-02-08 MED ORDER — GLYCOPYRROLATE 0.2 MG/ML IJ SOLN
INTRAMUSCULAR | Status: DC | PRN
  Administered 2014-02-08: 0.6 mg via INTRAVENOUS
  Administered 2014-02-08 (×2): 0.1 mg via INTRAVENOUS

## 2014-02-08 MED ORDER — ROCURONIUM BROMIDE 100 MG/10ML IV SOLN
INTRAVENOUS | Status: AC
  Filled 2014-02-08: qty 1

## 2014-02-08 MED ORDER — DEXAMETHASONE SODIUM PHOSPHATE 10 MG/ML IJ SOLN
INTRAMUSCULAR | Status: AC
  Filled 2014-02-08: qty 1

## 2014-02-08 MED ORDER — HYDROMORPHONE HCL 1 MG/ML IJ SOLN
0.2500 mg | INTRAMUSCULAR | Status: DC | PRN
  Administered 2014-02-08 (×2): 0.5 mg via INTRAVENOUS

## 2014-02-08 MED ORDER — PROMETHAZINE HCL 25 MG RE SUPP: 25.0000 mg | Freq: Once | RECTAL | Status: DC | PRN

## 2014-02-08 MED ORDER — LACTATED RINGERS IV SOLN
INTRAVENOUS | Status: DC
  Administered 2014-02-08 – 2014-02-09 (×2): via INTRAVENOUS

## 2014-02-08 MED ORDER — MEPERIDINE HCL 25 MG/ML IJ SOLN: 6.2500 mg | INTRAMUSCULAR | Status: DC | PRN

## 2014-02-08 MED ORDER — DIPHENHYDRAMINE HCL 50 MG/ML IJ SOLN
12.5000 mg | Freq: Four times a day (QID) | INTRAMUSCULAR | Status: DC | PRN
  Administered 2014-02-08: 12.5 mg via INTRAVENOUS
  Filled 2014-02-08: qty 1

## 2014-02-08 MED ORDER — 0.9 % SODIUM CHLORIDE (POUR BTL) OPTIME
TOPICAL | Status: DC | PRN
  Administered 2014-02-08 (×2): 1000 mL

## 2014-02-08 MED ORDER — ONDANSETRON HCL 4 MG/2ML IJ SOLN
INTRAMUSCULAR | Status: AC
  Filled 2014-02-08: qty 2

## 2014-02-08 MED ORDER — ALBUTEROL SULFATE (2.5 MG/3ML) 0.083% IN NEBU: 3.0000 mL | INHALATION_SOLUTION | Freq: Four times a day (QID) | RESPIRATORY_TRACT | Status: DC | PRN

## 2014-02-08 MED ORDER — SODIUM CHLORIDE 0.9 % IJ SOLN: 9.0000 mL | INTRAMUSCULAR | Status: DC | PRN

## 2014-02-08 SURGICAL SUPPLY — 34 items
BRR ADH 6X5 SEPRAFILM 1 SHT (MISCELLANEOUS)
CANISTER SUCT 3000ML (MISCELLANEOUS) ×3 IMPLANT
CLOTH BEACON ORANGE TIMEOUT ST (SAFETY) ×3 IMPLANT
CONT PATH 16OZ SNAP LID 3702 (MISCELLANEOUS) ×3 IMPLANT
DECANTER SPIKE VIAL GLASS SM (MISCELLANEOUS) ×1 IMPLANT
DRAPE WARM FLUID 44X44 (DRAPE) ×1 IMPLANT
DRSG OPSITE POSTOP 4X10 (GAUZE/BANDAGES/DRESSINGS) ×3 IMPLANT
DURAPREP 26ML APPLICATOR (WOUND CARE) ×3 IMPLANT
GAUZE SPONGE 4X4 16PLY XRAY LF (GAUZE/BANDAGES/DRESSINGS) ×3 IMPLANT
GLOVE BIOGEL PI IND STRL 6.5 (GLOVE) ×2 IMPLANT
GLOVE BIOGEL PI IND STRL 7.0 (GLOVE) ×4 IMPLANT
GLOVE BIOGEL PI INDICATOR 6.5 (GLOVE) ×1
GLOVE BIOGEL PI INDICATOR 7.0 (GLOVE) ×3
GLOVE SURG SS PI 6.0 STRL IVOR (GLOVE) ×3 IMPLANT
GOWN STRL REUS W/TWL LRG LVL3 (GOWN DISPOSABLE) ×10 IMPLANT
NDL HYPO 25X1 1.5 SAFETY (NEEDLE) ×2 IMPLANT
NEEDLE HYPO 25X1 1.5 SAFETY (NEEDLE) ×3 IMPLANT
NS IRRIG 1000ML POUR BTL (IV SOLUTION) ×4 IMPLANT
PACK ABDOMINAL GYN (CUSTOM PROCEDURE TRAY) ×3 IMPLANT
PAD OB MATERNITY 4.3X12.25 (PERSONAL CARE ITEMS) ×3 IMPLANT
PROTECTOR NERVE ULNAR (MISCELLANEOUS) ×3 IMPLANT
SEPRAFILM MEMBRANE 5X6 (MISCELLANEOUS) IMPLANT
SPONGE LAP 18X18 X RAY DECT (DISPOSABLE) ×1 IMPLANT
STAPLER VISISTAT 35W (STAPLE) IMPLANT
SUT PDS AB 1 CT1 36 (SUTURE) IMPLANT
SUT VIC AB 0 CT1 18XCR BRD8 (SUTURE) ×6 IMPLANT
SUT VIC AB 0 CT1 36 (SUTURE) ×12 IMPLANT
SUT VIC AB 0 CT1 8-18 (SUTURE) ×12
SUT VIC AB 3-0 FS2 27 (SUTURE) IMPLANT
SUT VICRYL 0 TIES 12 18 (SUTURE) ×3 IMPLANT
SYR CONTROL 10ML LL (SYRINGE) ×4 IMPLANT
TOWEL OR 17X24 6PK STRL BLUE (TOWEL DISPOSABLE) ×6 IMPLANT
TRAY FOLEY CATH 14FR (SET/KITS/TRAYS/PACK) ×3 IMPLANT
WATER STERILE IRR 1000ML POUR (IV SOLUTION) ×2 IMPLANT

## 2014-02-08 NOTE — Transfer of Care (Signed)
Immediate Anesthesia Transfer of Care Note  Patient: Regina Crawford  Procedure(s) Performed: Procedure(s): HYSTERECTOMY ABDOMINAL (N/A) BILATERAL SALPINGECTOMY (Bilateral)  Patient Location: PACU  Anesthesia Type:General  Level of Consciousness: awake, alert  and sedated  Airway & Oxygen Therapy: Patient Spontanous Breathing and Patient connected to face mask oxygen  Post-op Assessment: Report given to PACU RN, Patient moving all extremities X 4 and Nasal airay into right nare  Post vital signs: Reviewed and stable  Complications: No apparent anesthesia complications

## 2014-02-08 NOTE — Op Note (Addendum)
Regina Crawford PROCEDURE DATE: 02/08/2014  PREOPERATIVE DIAGNOSIS:  48 y.o. [id number] with symptomatic fibroids, menorrhagia POSTOPERATIVE DIAGNOSIS:  Same SURGEON:   Regina Crawford, M.D. ASSISTANT:   Regina Crawford, M.D. OPERATION:  Total abdominal hysterectomy with Bilateral Salpingectomy ANESTHESIA:  General endotracheal.  INDICATIONS: The patient is a 48 y.o. [id number] with history of symptomatic uterine fibroids/menorrhagia with failed medical management. The patient made a decision to undergo definite surgical treatment. On the preoperative visit, the risks, benefits, indications, and alternatives of the procedure were reviewed with the patient.  On the day of surgery, 48 the risks of surgery were again discussed with the patient including but not limited to: bleeding which may require transfusion or reoperation; infection which may require antibiotics; injury to bowel, bladder, ureters or other surrounding organs; need for additional procedures; thromboembolic phenomenon, incisional problems and other postoperative/anesthesia complications. Written informed consent was obtained.    OPERATIVE FINDINGS: An  63- week size uterus with normal tubes and ovaries bilaterally.  ESTIMATED BLOOD LOSS: 800 ml FLUIDS:  2300 ml of Lactated Ringers URINE OUTPUT:  400 ml of clear yellow urine. SPECIMENS:  Uterus and tubes sent to pathology COMPLICATIONS:  None immediate.   DESCRIPTION OF PROCEDURE: The patient received intravenous antibiotics and had sequential compression devices applied to her lower extremities while in the preoperative area.   She was taken to the operating room where anesthesia was induced and found to be adequate. The patient was placed in supine position. The abdomen and perineum were prepped and draped in a sterile manner, and a Foley catheter was inserted into the bladder and attached to Regina Crawford drainage. After an adequate timeout was performed, a Pfannensteil skin incision  was made. This incision was taken down to the fascia using electrocautery with care given to maintain good hemostasis. The fascia was incised in the midline and the fascial incision was then extended bilaterally using mayo scissors. The fascia was then dissected off the underlying rectus muscles using blunt and sharp dissection. The rectus muscles were split bluntly in the midline and the peritoneum entered sharply without complication. This peritoneal incision was then extended superiorly and inferiorly with care given to prevent bowel or bladder injury. Upon entry into the abdominal cavity, the upper abdomen was inspected and found to be normal. Attention was then turned to the pelvis. A retractor was placed into the incision, and the bowel was packed away with moist laparotomy sponges. The uterus at this point was noted to be mobilized. The round ligaments on each side were clamped, suture ligated, and transected with electrocautery allowing entry into the broad ligament. The anterior and posterior leaves of the broad ligament were separated and the ureters were inspected to be safely away from the area of dissection bilaterally. A hole was created in the clear portion of the posterior broad ligament. Adnexae were clamped on the patient's right side. This pedicle was then clamped, cut, and doubly suture ligated with good hemostasis. This procedure was repeated in an identical fashion on the left site allowing for both adnexa to remain in place.   A bladder flap was then created across the anterior leaf of the broad ligament and the bladder was then bluntly dissected off the lower uterine segment and cervix with good hemostasis. The uterine arteries were then skeletonized bilaterally and then clamped, cut, and ligated with care given to prevent ureteral injury. The uterosacral ligaments were then clamped, cut, and ligated bilaterally. Finally, the cardinal ligaments were clamped, cut, and ligated bilaterally.  Acutely curved clamps were placed across the vagina just under the cervix, and the specimen was amputated and sent to pathology. The vaginal cuff was closed with a series of interrupted 0 Vicryl figure-of-eight sutures with care given to incorporate the anterior pubocervical fascia and the posterior rectovaginal fascia.  The vaginal cuff angles were noted to have good hemostasis.  The fallopian tubes on both sides were clamped with Kelly clamps, transected, making sure to incorporate the fimbriated ends and suture ligated. The pelvis was irrigated and hemostasis was reconfirmed at all pedicles and along the pelvic sidewall.  All laparotomy sponges and instruments were removed from the abdomen. The fascia was closed with 0 Vicryl in a running fashion. The subcutaneous layer was reapproximated with 2-0 plain gut. The skin was closed with a 4-0 Monocryl subcuticular stitch. Sponge, lap, needle, and instrument counts were correct times two. The patient was taken to the recovery area awake, extubated and in stable condition.

## 2014-02-08 NOTE — Anesthesia Postprocedure Evaluation (Signed)
  Anesthesia Post-op Note  Patient: Regina Crawford  Procedure(s) Performed: Procedure(s) (LRB): HYSTERECTOMY ABDOMINAL (N/A) BILATERAL SALPINGECTOMY (Bilateral)  Patient Location: PACU  Anesthesia Type: General  Level of Consciousness: awake and alert   Airway and Oxygen Therapy: Patient Spontanous Breathing  Post-op Pain: mild  Post-op Assessment: Post-op Vital signs reviewed, Patient's Cardiovascular Status Stable, Respiratory Function Stable, Patent Airway and No signs of Nausea or vomiting  Last Vitals:  Filed Vitals:   02/08/14 1726  BP: 141/88  Pulse: 94  Temp: 37.2 C  Resp: 14    Post-op Vital Signs: stable   Complications: No apparent anesthesia complications

## 2014-02-08 NOTE — Anesthesia Preprocedure Evaluation (Signed)
Anesthesia Evaluation  Patient identified by MRN, date of birth, ID band Patient awake    Reviewed: Allergy & Precautions, H&P , NPO status , Patient's Chart, lab work & pertinent test results  Airway Mallampati: III  TM Distance: >3 FB Neck ROM: full    Dental no notable dental hx. (+) Teeth Intact   Pulmonary    Pulmonary exam normal       Cardiovascular hypertension, Pt. on medications Rhythm:regular     Neuro/Psych negative neurological ROS  negative psych ROS   GI/Hepatic negative GI ROS, Neg liver ROS,   Endo/Other  Morbid obesity  Renal/GU negative Renal ROS     Musculoskeletal   Abdominal (+) + obese,   Peds  Hematology   Anesthesia Other Findings   Reproductive/Obstetrics negative OB ROS                             Anesthesia Physical Anesthesia Plan  ASA: III  Anesthesia Plan: General   Post-op Pain Management:    Induction: Intravenous  Airway Management Planned: Oral ETT  Additional Equipment:   Intra-op Plan:   Post-operative Plan: Extubation in OR  Informed Consent: I have reviewed the patients History and Physical, chart, labs and discussed the procedure including the risks, benefits and alternatives for the proposed anesthesia with the patient or authorized representative who has indicated his/her understanding and acceptance.   Dental Advisory Given  Plan Discussed with: CRNA and Surgeon  Anesthesia Plan Comments:         Anesthesia Quick Evaluation

## 2014-02-09 LAB — CBC
HEMATOCRIT: 30.1 % — AB (ref 36.0–46.0)
HEMOGLOBIN: 9.9 g/dL — AB (ref 12.0–15.0)
MCH: 30.5 pg (ref 26.0–34.0)
MCHC: 32.9 g/dL (ref 30.0–36.0)
MCV: 92.6 fL (ref 78.0–100.0)
Platelets: 261 10*3/uL (ref 150–400)
RBC: 3.25 MIL/uL — AB (ref 3.87–5.11)
RDW: 13.3 % (ref 11.5–15.5)
WBC: 9.6 10*3/uL (ref 4.0–10.5)

## 2014-02-09 MED ORDER — IBUPROFEN 600 MG PO TABS
600.0000 mg | ORAL_TABLET | Freq: Four times a day (QID) | ORAL | Status: DC | PRN
Start: 2014-02-09 — End: 2014-02-10

## 2014-02-09 MED ORDER — OXYCODONE-ACETAMINOPHEN 5-325 MG PO TABS
1.0000 | ORAL_TABLET | ORAL | Status: DC | PRN
  Administered 2014-02-09 (×2): 2 via ORAL
  Filled 2014-02-09 (×3): qty 2

## 2014-02-09 NOTE — Telephone Encounter (Signed)
Pt is currently in the hospital. 

## 2014-02-09 NOTE — Progress Notes (Signed)
1 Day Post-Op Procedure(s) (LRB): HYSTERECTOMY ABDOMINAL (N/A) BILATERAL SALPINGECTOMY (Bilateral)  Subjective: Patient reports doing well, her pain is well controlled. She ambulated a little this morning. She denies CP, SOB, lightheadedness/dizziness, nausea or emesis. She tolerated sips of clears. No flatus yet.    Objective: I have reviewed patient's vital signs, intake and output, medications and labs.  General: alert, cooperative and no distress Resp: clear to auscultation bilaterally Cardio: regular rate and rhythm GI: soft, appropriately tender, non distended Extremities: extremities normal, atraumatic, no cyanosis or edema and Homans sign is negative, no sign of DVT incision: honeycomb dressing minimally stained with dark blood  Assessment: s/p Procedure(s): HYSTERECTOMY ABDOMINAL (N/A) BILATERAL SALPINGECTOMY (Bilateral): stable  Plan: Encourage ambulation Clear liquids, will advance to regular for dinner Discharge plans for tomorrow  LOS: 1 day    Regina Crawford 02/09/2014, 12:09 PM

## 2014-02-09 NOTE — Discharge Summary (Signed)
Physician Discharge Summary  Patient ID: Regina Crawford MRN: [id number] DOB/AGE: 03-25-1965 48 y.o.  Admit date: 02/08/2014 Discharge date: 02/10/2014  Admission Diagnoses: Symptomatic fibroid uterus-menorrhagia and pelvic pain  Discharge Diagnoses: s/p TAH with bilateral salpingectomy Active Problems:   S/P TAH (total abdominal hysterectomy)   Discharged Condition: good  Hospital Course: patient admitted for scheduled surgical management of her symptomatic fibroid uterus with hysterectomy and bilateral salpingectomy. Patient underwent an uncomplicated TAH/BS on 74/16. Throughout her hospitalization she remained afebrile, was able to tolerate a regular diet, ambulated and was found stable for discharge on POD#2. Discharge instructions provided  Consults: None  Significant Diagnostic Studies: labs: pre-op Hg 11--> post op Hg 9.9  Treatments: surgery: TAH/BS  Discharge Exam: Blood pressure 124/67, pulse 93, temperature 98.8 F (37.1 C), temperature source Oral, resp. rate 18, height 5\' 3"  (1.6 m), weight 219 lb (99.338 kg), SpO2 98 %. General appearance: alert, cooperative and no distress Resp: clear to auscultation bilaterally Cardio: regular rate and rhythm GI: soft, non-tender; bowel sounds normal; no masses,  no organomegaly Extremities: extremities normal, atraumatic, no cyanosis or edema and Homans sign is negative, no sign of DVT Incision/Wound:honey comb dressing minimally stained (similar in appearance to POD#1)  Disposition: 01-Home or Self Care     Medication List    STOP taking these medications        ALEVE 220 MG tablet  Generic drug:  naproxen sodium     HYDROcodone-acetaminophen 5-325 MG per tablet  Commonly known as:  NORCO/VICODIN     medroxyPROGESTERone 10 MG tablet  Commonly known as:  PROVERA      TAKE these medications        albuterol 108 (90 BASE) MCG/ACT inhaler  Commonly known as:  PROVENTIL HFA;VENTOLIN HFA  Inhale 2 puffs into the  lungs every 6 (six) hours as needed for wheezing or shortness of breath.     budesonide-formoterol 80-4.5 MCG/ACT inhaler  Commonly known as:  SYMBICORT  Inhale 2 puffs into the lungs 2 (two) times daily.     docusate sodium 100 MG capsule  Commonly known as:  COLACE  Take 1 capsule (100 mg total) by mouth 2 (two) times daily as needed.     ferrous sulfate 325 (65 FE) MG EC tablet  Take 1 tablet (325 mg total) by mouth 2 (two) times daily with a meal.     hydrochlorothiazide 12.5 MG capsule  Commonly known as:  MICROZIDE  Take 1 capsule (12.5 mg total) by mouth 2 (two) times daily.     ibuprofen 600 MG tablet  Commonly known as:  ADVIL,MOTRIN  Take 1 tablet (600 mg total) by mouth every 6 (six) hours as needed for fever or headache.     oxyCODONE-acetaminophen 5-325 MG per tablet  Commonly known as:  PERCOCET/ROXICET  Take 1-2 tablets by mouth every 4 (four) hours as needed for moderate pain or severe pain.     Vitamin D 2000 UNITS Caps  Take 1 capsule by mouth daily.     WOMENS MULTIVITAMIN PLUS Tabs  Take 1 tablet by mouth daily.         Signed: Delorise Hunkele 02/10/2014, 7:52 AM

## 2014-02-09 NOTE — Anesthesia Postprocedure Evaluation (Signed)
  Anesthesia Post-op Note  Patient: Regina Crawford  Procedure(s) Performed: Procedure(s): HYSTERECTOMY ABDOMINAL (N/A) BILATERAL SALPINGECTOMY (Bilateral)  Patient Location: [facility]  Anesthesia Type:General  Level of Consciousness: awake, alert  and oriented  Airway and Oxygen Therapy: Patient Spontanous Breathing and Patient connected to nasal cannula oxygen  Post-op Pain: none  Post-op Assessment: Post-op Vital signs reviewed, Patient's Cardiovascular Status Stable, Respiratory Function Stable, No signs of Nausea or vomiting, Adequate PO intake and Pain level controlled  Post-op Vital Signs: Reviewed and stable  Last Vitals:  Filed Vitals:   02/09/14 0542  BP: 134/77  Pulse: 94  Temp: 37.1 C  Resp: 14    Complications: No apparent anesthesia complications

## 2014-02-09 NOTE — Discharge Instructions (Signed)
Abdominal Hysterectomy, Care After Refer to this sheet in the next few weeks. These instructions provide you with information on caring for yourself after your procedure. Your health care provider may also give you more specific instructions. Your treatment has been planned according to current medical practices, but problems sometimes occur. Call your health care provider if you have any problems or questions after your procedure.  WHAT TO EXPECT AFTER THE PROCEDURE After your procedure, it is typical to have the following:  Pain.  Feeling tired.  Poor appetite.  Less interest in sex. HOME CARE INSTRUCTIONS  It takes 4-6 weeks to recover from this surgery. Make sure you follow all your health care provider's instructions. Home care instructions may include:  Take pain medicines only as directed by your health care provider. Do not take over-the-counter pain medicines without checking with your health care provider first.  Change your bandage as directed by your health care provider.  Return to your health care provider to have your sutures taken out.  Take showers instead of baths for 2-3 weeks. Ask your health care provider when it is safe to start showering.  Do not douche, use tampons, or have sexual intercourse for at least 6 weeks or until your health care provider says you can.   Follow your health care provider's advice about exercise, lifting, driving, and general activities.  Get plenty of rest and sleep.   Do not lift anything heavier than a gallon of milk (about 10 lb [4.5 kg]) for the first month after surgery.  You can resume your normal diet if your health care provider says it is okay.   Do not drink alcohol until your health care provider says you can.   If you are constipated, ask your health care provider if you can take a mild laxative.  Eating foods high in fiber may also help with constipation. Eat plenty of raw fruits and vegetables, whole grains, and  beans.  Drink enough fluids to keep your urine clear or pale yellow.   Try to have someone at home with you for the first 1-2 weeks to help around the house.  Keep all follow-up appointments. SEEK MEDICAL CARE IF:   You have chills or fever.  You have swelling, redness, or pain in the area of your incision that is getting worse.   You have pus coming from the incision.   You notice a bad smell coming from the incision or bandage.   Your incision breaks open.   You feel dizzy or light-headed.   You have pain or bleeding when you urinate.   You have persistent diarrhea.   You have persistent nausea and vomiting.   You have abnormal vaginal discharge.   You have a rash.   You have any type of abnormal reaction or develop an allergy to your medicine.   Your pain medicine is not helping.  SEEK IMMEDIATE MEDICAL CARE IF:   You have a fever and your symptoms suddenly get worse.  You have severe abdominal pain.  You have chest pain.  You have shortness of breath.  You faint.  You have pain, swelling, or redness of your leg.  You have heavy vaginal bleeding with blood clots. MAKE SURE YOU:  Understand these instructions.  Will watch your condition.  Will get help right away if you are not doing well or get worse. Document Released: 08/24/2004 Document Revised: 02/09/2013 Document Reviewed: 11/27/2012 Exit[employer] Patient Information 2015 [employer]. This information is not intended   to replace advice given to you by your health care provider. Make sure you discuss any questions you have with your health care provider.  

## 2014-02-09 NOTE — Addendum Note (Signed)
Addendum  created 02/09/14 0813 by Jonna Munro, CRNA   Modules edited: Notes Section   Notes Section:  File: [id number]

## 2014-02-10 MED ORDER — OXYCODONE-ACETAMINOPHEN 5-325 MG PO TABS: 1.0000 | ORAL_TABLET | ORAL | Status: DC | PRN

## 2014-02-10 MED ORDER — IBUPROFEN 600 MG PO TABS: 600.0000 mg | ORAL_TABLET | Freq: Four times a day (QID) | ORAL | Status: DC | PRN

## 2014-02-10 MED ORDER — BISACODYL 10 MG RE SUPP
10.0000 mg | Freq: Once | RECTAL | Status: AC
  Administered 2014-02-10: 10 mg via RECTAL
  Filled 2014-02-10: qty 1

## 2014-02-10 MED ORDER — DOCUSATE SODIUM 100 MG PO CAPS: 100.0000 mg | ORAL_CAPSULE | Freq: Two times a day (BID) | ORAL | Status: DC | PRN

## 2014-02-10 NOTE — Progress Notes (Signed)
Ambulated out teaching complete  

## 2014-02-11 ENCOUNTER — Encounter (HOSPITAL_COMMUNITY): Payer: Self-pay | Admitting: Obstetrics and Gynecology

## 2014-02-14 ENCOUNTER — Ambulatory Visit: Payer: No Typology Code available for payment source | Attending: Internal Medicine | Admitting: Internal Medicine

## 2014-02-14 ENCOUNTER — Encounter: Payer: Self-pay | Admitting: Internal Medicine

## 2014-02-14 VITALS — BP 137/84 | HR 96 | Temp 99.3°F | Resp 16 | Wt 213.0 lb

## 2014-02-14 DIAGNOSIS — J45909 Unspecified asthma, uncomplicated: Secondary | ICD-10-CM | POA: Insufficient documentation

## 2014-02-14 DIAGNOSIS — Z9079 Acquired absence of other genital organ(s): Secondary | ICD-10-CM | POA: Insufficient documentation

## 2014-02-14 DIAGNOSIS — K5900 Constipation, unspecified: Secondary | ICD-10-CM | POA: Insufficient documentation

## 2014-02-14 DIAGNOSIS — G4730 Sleep apnea, unspecified: Secondary | ICD-10-CM | POA: Insufficient documentation

## 2014-02-14 DIAGNOSIS — I10 Essential (primary) hypertension: Secondary | ICD-10-CM | POA: Insufficient documentation

## 2014-02-14 DIAGNOSIS — E785 Hyperlipidemia, unspecified: Secondary | ICD-10-CM | POA: Insufficient documentation

## 2014-02-14 DIAGNOSIS — Z7951 Long term (current) use of inhaled steroids: Secondary | ICD-10-CM | POA: Insufficient documentation

## 2014-02-14 DIAGNOSIS — D638 Anemia in other chronic diseases classified elsewhere: Secondary | ICD-10-CM | POA: Insufficient documentation

## 2014-02-14 DIAGNOSIS — Z90710 Acquired absence of both cervix and uterus: Secondary | ICD-10-CM | POA: Insufficient documentation

## 2014-02-14 DIAGNOSIS — D259 Leiomyoma of uterus, unspecified: Secondary | ICD-10-CM | POA: Insufficient documentation

## 2014-02-14 DIAGNOSIS — Z79899 Other long term (current) drug therapy: Secondary | ICD-10-CM | POA: Insufficient documentation

## 2014-02-14 LAB — COMPLETE METABOLIC PANEL WITH GFR
ALK PHOS: 45 U/L (ref 39–117)
ALT: 17 U/L (ref 0–35)
AST: 19 U/L (ref 0–37)
Albumin: 3.9 g/dL (ref 3.5–5.2)
BILIRUBIN TOTAL: 0.2 mg/dL (ref 0.2–1.2)
BUN: 5 mg/dL — ABNORMAL LOW (ref 6–23)
CO2: 29 mEq/L (ref 19–32)
Calcium: 9.5 mg/dL (ref 8.4–10.5)
Chloride: 102 mEq/L (ref 96–112)
Creat: 0.8 mg/dL (ref 0.50–1.10)
GFR, Est African American: >89 mL/min
GFR, Est Non African American: 87 mL/min
Glucose, Bld: 117 mg/dL — ABNORMAL HIGH (ref 70–99)
POTASSIUM: 4 meq/L (ref 3.5–5.3)
Sodium: 138 mEq/L (ref 135–145)
Total Protein: 6.6 g/dL (ref 6.0–8.3)

## 2014-02-14 MED ORDER — MAGNESIUM HYDROXIDE 400 MG/5ML PO SUSP: 5.0000 mL | Freq: Every day | ORAL | Status: DC | PRN

## 2014-02-14 NOTE — Progress Notes (Signed)
MRN: [id number] Name: Regina Crawford  Sex: female Age: 48 y.o. DOB: 1965-04-02  Allergies: Review of patient's allergies indicates no known allergies.  Chief Complaint  Patient presents with  . Follow-up    HPI: Patient is 48 y.o. female who has history of hypertension, anemia secondary to fibroid uterus, as per patient she had  hysterectomy done recently and is scheduled to follow with her gynecologist, currently patient is requesting refill on her blood pressure medication as per patient she ran out of the medication denies any headache dizziness chest and shortness of breath, she does complain of constipation and is requesting some medication  Past Medical History  Diagnosis Date  . Asthma   . Hypertension   . Anemia   . SVD (spontaneous vaginal delivery)     x 2 ( one in [place] and one at [facility])  . Hyperlipidemia     diet controlled - no med  . Sleep apnea     does not use CPAP every night    Past Surgical History  Procedure Laterality Date  . Wisdom tooth extraction    . Abdominal hysterectomy N/A 02/08/2014    Procedure: HYSTERECTOMY ABDOMINAL;  Surgeon: Mora Bellman, MD;  Location: [facility];  Service: Gynecology;  Laterality: N/A;  . Bilateral salpingectomy Bilateral 02/08/2014    Procedure: BILATERAL SALPINGECTOMY;  Surgeon: Mora Bellman, MD;  Location: [facility];  Service: Gynecology;  Laterality: Bilateral;      Medication List       This list is accurate as of: 02/14/14  3:36 PM.  Always use your most recent med list.               albuterol 108 (90 BASE) MCG/ACT inhaler  Commonly known as:  PROVENTIL HFA;VENTOLIN HFA  Inhale 2 puffs into the lungs every 6 (six) hours as needed for wheezing or shortness of breath.     budesonide-formoterol 80-4.5 MCG/ACT inhaler  Commonly known as:  SYMBICORT  Inhale 2 puffs into the lungs 2 (two) times daily.     docusate sodium 100 MG capsule  Commonly known as:  COLACE  Take 1 capsule (100 mg total) by mouth 2  (two) times daily as needed.     ferrous sulfate 325 (65 FE) MG EC tablet  Take 1 tablet (325 mg total) by mouth 2 (two) times daily with a meal.     hydrochlorothiazide 12.5 MG capsule  Commonly known as:  MICROZIDE  TAKE 1 CAPSULE BY MOUTH DAILY     ibuprofen 600 MG tablet  Commonly known as:  ADVIL,MOTRIN  Take 1 tablet (600 mg total) by mouth every 6 (six) hours as needed for fever or headache.     magnesium hydroxide 400 MG/5ML suspension  Commonly known as:  MILK OF MAGNESIA  Take 5 mLs by mouth daily as needed for mild constipation.     oxyCODONE-acetaminophen 5-325 MG per tablet  Commonly known as:  PERCOCET/ROXICET  Take 1-2 tablets by mouth every 4 (four) hours as needed for moderate pain or severe pain.     Vitamin D 2000 UNITS Caps  Take 1 capsule by mouth daily.     WOMENS MULTIVITAMIN PLUS Tabs  Take 1 tablet by mouth daily.        Meds ordered this encounter  Medications  . magnesium hydroxide (MILK OF MAGNESIA) 400 MG/5ML suspension    Sig: Take 5 mLs by mouth daily as needed for mild constipation.    Dispense:  360 mL  Refill:  0    Immunization History  Administered Date(s) Administered  . Influenza,inj,Quad PF,36+ Mos 11/03/2013  . Pneumococcal Polysaccharide-23 07/20/1998  . Td 06/19/2002    Family History  Problem Relation Age of Onset  . Diabetes Father   . Arthritis Sister   . Arthritis Daughter   . Arthritis Son   . Arthritis Paternal Aunt     History  Substance Use Topics  . Smoking status: Never Smoker   . Smokeless tobacco: Never Used  . Alcohol Use: Yes     Comment: occassional    Review of Systems   As noted in HPI  Filed Vitals:   02/14/14 1513  BP: 137/84  Pulse: 96  Temp: 99.3 F (37.4 C)  Resp: 16    Physical Exam  Physical Exam  Constitutional: No distress.  Eyes: EOM are normal. Pupils are equal, round, and reactive to light.  Cardiovascular: Normal rate and regular rhythm.   Pulmonary/Chest: Breath  sounds normal.  Abdominal: Soft. There is no tenderness. There is no rebound and no guarding.  Musculoskeletal: She exhibits no edema.    CBC    Component Value Date/Time   WBC 9.6 02/09/2014 0524   RBC 3.25* 02/09/2014 0524   HGB 9.9* 02/09/2014 0524   HCT 30.1* 02/09/2014 0524   PLT 261 02/09/2014 0524   MCV 92.6 02/09/2014 0524   LYMPHSABS 2.0 11/03/2013 1725   MONOABS 0.5 11/03/2013 1725   EOSABS 0.1 11/03/2013 1725   BASOSABS 0.0 11/03/2013 1725    CMP     Component Value Date/Time   NA 139 02/01/2014 1140   K 3.6* 02/01/2014 1140   CL 102 02/01/2014 1140   CO2 26 02/01/2014 1140   GLUCOSE 98 02/01/2014 1140   BUN 7 02/01/2014 1140   CREATININE 0.75 02/01/2014 1140   CREATININE 0.86 11/03/2013 1725   CALCIUM 9.4 02/01/2014 1140   PROT 7.2 11/03/2013 1725   ALBUMIN 4.3 11/03/2013 1725   AST 18 11/03/2013 1725   ALT 13 11/03/2013 1725   ALKPHOS 50 11/03/2013 1725   BILITOT 0.4 11/03/2013 1725   GFRNONAA >90 02/01/2014 1140   GFRNONAA 80 11/03/2013 1725   GFRAA >90 02/01/2014 1140   GFRAA >89 11/03/2013 1725    Lab Results  Component Value Date/Time   CHOL 195 05/30/2009 07:59 PM    No components found for: HGA1C  Lab Results  Component Value Date/Time   AST 18 11/03/2013 05:25 PM    Assessment and Plan  Essential hypertension, benign - Plan: patient is given refill on hydrochlorothiazide, repeat her blood chemistry COMPLETE METABOLIC PANEL WITH GFR  Constipation, unspecified constipation type - Plan: advised patient for high fiber diet, trial of magnesium hydroxide (MILK OF MAGNESIA) 400 MG/5ML suspension   Return in about 3 months (around 05/16/2014) for hypertension.  Lorayne Marek, MD

## 2014-02-14 NOTE — Progress Notes (Signed)
Patient here for follow up Post hysterectomy on 02/08/2014 Temperature in office is 99.3

## 2014-02-25 ENCOUNTER — Encounter: Payer: Self-pay | Admitting: *Deleted

## 2014-02-28 ENCOUNTER — Ambulatory Visit: Payer: Self-pay | Attending: Internal Medicine

## 2014-03-02 ENCOUNTER — Other Ambulatory Visit: Payer: Self-pay | Admitting: Internal Medicine

## 2014-03-02 DIAGNOSIS — J4541 Moderate persistent asthma with (acute) exacerbation: Secondary | ICD-10-CM

## 2014-03-02 MED ORDER — BUDESONIDE-FORMOTEROL FUMARATE 80-4.5 MCG/ACT IN AERO: 2.0000 | INHALATION_SPRAY | Freq: Two times a day (BID) | RESPIRATORY_TRACT | Status: DC

## 2014-03-02 MED ORDER — ALBUTEROL SULFATE HFA 108 (90 BASE) MCG/ACT IN AERS: 2.0000 | INHALATION_SPRAY | Freq: Four times a day (QID) | RESPIRATORY_TRACT | Status: DC | PRN

## 2014-03-10 ENCOUNTER — Other Ambulatory Visit: Payer: Self-pay

## 2014-03-10 DIAGNOSIS — Z1231 Encounter for screening mammogram for malignant neoplasm of breast: Secondary | ICD-10-CM

## 2014-03-16 ENCOUNTER — Encounter: Payer: Self-pay | Admitting: Obstetrics and Gynecology

## 2014-03-16 ENCOUNTER — Ambulatory Visit (INDEPENDENT_AMBULATORY_CARE_PROVIDER_SITE_OTHER): Payer: Self-pay | Admitting: Obstetrics and Gynecology

## 2014-03-16 VITALS — BP 136/82 | HR 89 | Temp 98.5°F | Ht 64.0 in | Wt 211.2 lb

## 2014-03-16 DIAGNOSIS — Z9889 Other specified postprocedural states: Secondary | ICD-10-CM

## 2014-03-16 NOTE — Progress Notes (Signed)
Patient ID: Regina Crawford, female   DOB: 05/01/1965, 49 y.o.   MRN: [id number] 49 yo [id number] s/p total abdominal hysterectomy with bilateral salpingectomy on 02/08/2014 presenting today for post-op check. Patient is doing well and is without any complaints. She denies any fevers or abnormal drainage from her incision.  Past Medical History  Diagnosis Date  . Asthma   . Hypertension   . Anemia   . SVD (spontaneous vaginal delivery)     x 2 ( one in [place] and one at [facility])  . Hyperlipidemia     diet controlled - no med  . Sleep apnea     does not use CPAP every night   Past Surgical History  Procedure Laterality Date  . Wisdom tooth extraction    . Abdominal hysterectomy N/A 02/08/2014    Procedure: HYSTERECTOMY ABDOMINAL;  Surgeon: Mora Bellman, MD;  Location: [facility];  Service: Gynecology;  Laterality: N/A;  . Bilateral salpingectomy Bilateral 02/08/2014    Procedure: BILATERAL SALPINGECTOMY;  Surgeon: Mora Bellman, MD;  Location: [facility];  Service: Gynecology;  Laterality: Bilateral;   Family History  Problem Relation Age of Onset  . Diabetes Father   . Arthritis Sister   . Arthritis Daughter   . Arthritis Son   . Arthritis Paternal Aunt    History  Substance Use Topics  . Smoking status: Never Smoker   . Smokeless tobacco: Never Used  . Alcohol Use: Yes     Comment: occassional   GENERAL: Well-developed, well-nourished female in no acute distress.  ABDOMEN: Soft, nontender, nondistended. No organomegaly Incision: no erythema, induration or drainage. Healed very well PELVIC: Normal external female genitalia. Vagina is pink and rugated.  Vaginal vault intact EXTREMITIES: No cyanosis, clubbing, or edema, 2+ distal pulses.  A/P 49 yo s/p TAH here for post op check - Patient is doing very well - She is medically cleared to resume all activities of daily living - pathology results reviewed with the patient - weight loss strategies reviewed with patient. Discussed weight  bearing exercises to preserve bone health - Patient scheduled for mammogram on 04/15/2014. Informed patient for need of annual pelvic exam despite the lack of cervix

## 2014-03-31 ENCOUNTER — Telehealth: Payer: Self-pay | Admitting: *Deleted

## 2014-04-05 ENCOUNTER — Ambulatory Visit: Payer: Self-pay

## 2014-04-12 NOTE — Telephone Encounter (Signed)
Open in error

## 2014-04-14 ENCOUNTER — Ambulatory Visit: Payer: Self-pay

## 2014-04-18 ENCOUNTER — Ambulatory Visit: Payer: Self-pay

## 2014-05-10 ENCOUNTER — Ambulatory Visit: Payer: Self-pay

## 2014-05-12 ENCOUNTER — Ambulatory Visit: Payer: Self-pay

## 2014-05-13 ENCOUNTER — Ambulatory Visit: Payer: Self-pay

## 2014-05-19 ENCOUNTER — Ambulatory Visit: Payer: Self-pay | Attending: Internal Medicine | Admitting: Internal Medicine

## 2014-05-19 ENCOUNTER — Encounter: Payer: Self-pay | Admitting: Internal Medicine

## 2014-05-19 VITALS — BP 130/70 | HR 89 | Temp 98.4°F | Resp 16 | Wt 214.0 lb

## 2014-05-19 DIAGNOSIS — Z862 Personal history of diseases of the blood and blood-forming organs and certain disorders involving the immune mechanism: Secondary | ICD-10-CM

## 2014-05-19 DIAGNOSIS — R7301 Impaired fasting glucose: Secondary | ICD-10-CM | POA: Insufficient documentation

## 2014-05-19 DIAGNOSIS — I10 Essential (primary) hypertension: Secondary | ICD-10-CM | POA: Insufficient documentation

## 2014-05-19 DIAGNOSIS — J45909 Unspecified asthma, uncomplicated: Secondary | ICD-10-CM | POA: Insufficient documentation

## 2014-05-19 DIAGNOSIS — Z23 Encounter for immunization: Secondary | ICD-10-CM | POA: Insufficient documentation

## 2014-05-19 DIAGNOSIS — J4530 Mild persistent asthma, uncomplicated: Secondary | ICD-10-CM

## 2014-05-19 LAB — CBC WITH DIFFERENTIAL/PLATELET
BASOS ABS: 0 10*3/uL (ref 0.0–0.1)
Basophils Relative: 1 % (ref 0–1)
EOS ABS: 0.2 10*3/uL (ref 0.0–0.7)
EOS PCT: 5 % (ref 0–5)
HCT: 36.1 % (ref 36.0–46.0)
HEMOGLOBIN: 12 g/dL (ref 12.0–15.0)
LYMPHS PCT: 41 % (ref 12–46)
Lymphs Abs: 1.5 10*3/uL (ref 0.7–4.0)
MCH: 30.3 pg (ref 26.0–34.0)
MCHC: 33.2 g/dL (ref 30.0–36.0)
MCV: 91.2 fL (ref 78.0–100.0)
MPV: 9.1 fL (ref 8.6–12.4)
Monocytes Absolute: 0.3 10*3/uL (ref 0.1–1.0)
Monocytes Relative: 9 % (ref 3–12)
Neutro Abs: 1.6 10*3/uL — ABNORMAL LOW (ref 1.7–7.7)
Neutrophils Relative %: 44 % (ref 43–77)
Platelets: 331 10*3/uL (ref 150–400)
RBC: 3.96 MIL/uL (ref 3.87–5.11)
RDW: 14.1 % (ref 11.5–15.5)
WBC: 3.7 10*3/uL — AB (ref 4.0–10.5)

## 2014-05-19 LAB — COMPLETE METABOLIC PANEL WITH GFR
ALBUMIN: 4.2 g/dL (ref 3.5–5.2)
ALK PHOS: 49 U/L (ref 39–117)
ALT: 21 U/L (ref 0–35)
AST: 22 U/L (ref 0–37)
BILIRUBIN TOTAL: 0.2 mg/dL (ref 0.2–1.2)
BUN: 11 mg/dL (ref 6–23)
CO2: 29 mEq/L (ref 19–32)
Calcium: 9.3 mg/dL (ref 8.4–10.5)
Chloride: 99 mEq/L (ref 96–112)
Creat: 0.72 mg/dL (ref 0.50–1.10)
GFR, Est African American: >89 mL/min
GFR, Est Non African American: >89 mL/min
GLUCOSE: 84 mg/dL (ref 70–99)
POTASSIUM: 3.9 meq/L (ref 3.5–5.3)
Sodium: 138 mEq/L (ref 135–145)
Total Protein: 7.1 g/dL (ref 6.0–8.3)

## 2014-05-19 LAB — HEMOGLOBIN A1C
Hgb A1c MFr Bld: 5.9 % — ABNORMAL HIGH (ref <5.7)
Mean Plasma Glucose: 123 mg/dL — ABNORMAL HIGH (ref <117)

## 2014-05-19 MED ORDER — BUDESONIDE-FORMOTEROL FUMARATE 80-4.5 MCG/ACT IN AERO: 2.0000 | INHALATION_SPRAY | Freq: Two times a day (BID) | RESPIRATORY_TRACT | Status: DC

## 2014-05-19 MED ORDER — HYDROCHLOROTHIAZIDE 12.5 MG PO CAPS: 12.5000 mg | ORAL_CAPSULE | Freq: Every day | ORAL | Status: DC

## 2014-05-19 MED ORDER — ALBUTEROL SULFATE HFA 108 (90 BASE) MCG/ACT IN AERS: 2.0000 | INHALATION_SPRAY | Freq: Four times a day (QID) | RESPIRATORY_TRACT | Status: DC | PRN

## 2014-05-19 NOTE — Progress Notes (Signed)
Patient here for follow up on her hypertension and medication refills

## 2014-05-19 NOTE — Progress Notes (Signed)
MRN: [id number] Name: Regina Crawford  Sex: female Age: 49 y.o. DOB: 24-Apr-1965  Allergies: Review of patient's allergies indicates no known allergies.  Chief Complaint  Patient presents with  . Follow-up    HPI: Patient is 49 y.o. female who history of hypertension, asthma comes today for followup and requesting refill on her medications, she denies any acute symptoms denies any headache dizziness chest and shortness of breath, she also has history of anemia secondary to fibroid uterus currently she is taking iron pills 2 times a day. Patient denies smoking cigarettes.  Past Medical History  Diagnosis Date  . Asthma   . Hypertension   . Anemia   . SVD (spontaneous vaginal delivery)     x 2 ( one in [place] and one at [facility])  . Hyperlipidemia     diet controlled - no med  . Sleep apnea     does not use CPAP every night    Past Surgical History  Procedure Laterality Date  . Wisdom tooth extraction    . Abdominal hysterectomy N/A 02/08/2014    Procedure: HYSTERECTOMY ABDOMINAL;  Surgeon: Mora Bellman, MD;  Location: [facility];  Service: Gynecology;  Laterality: N/A;  . Bilateral salpingectomy Bilateral 02/08/2014    Procedure: BILATERAL SALPINGECTOMY;  Surgeon: Mora Bellman, MD;  Location: [facility];  Service: Gynecology;  Laterality: Bilateral;      Medication List       This list is accurate as of: 05/19/14 11:57 AM.  Always use your most recent med list.               albuterol 108 (90 BASE) MCG/ACT inhaler  Commonly known as:  PROVENTIL HFA;VENTOLIN HFA  Inhale 2 puffs into the lungs every 6 (six) hours as needed for wheezing or shortness of breath.     budesonide-formoterol 80-4.5 MCG/ACT inhaler  Commonly known as:  SYMBICORT  Inhale 2 puffs into the lungs 2 (two) times daily.     docusate sodium 100 MG capsule  Commonly known as:  COLACE  Take 1 capsule (100 mg total) by mouth 2 (two) times daily as needed.     ferrous sulfate 325 (65 FE) MG EC tablet    Take 1 tablet (325 mg total) by mouth 2 (two) times daily with a meal.     hydrochlorothiazide 12.5 MG capsule  Commonly known as:  MICROZIDE  Take 1 capsule (12.5 mg total) by mouth daily.     ibuprofen 600 MG tablet  Commonly known as:  ADVIL,MOTRIN  Take 1 tablet (600 mg total) by mouth every 6 (six) hours as needed for fever or headache.     magnesium hydroxide 400 MG/5ML suspension  Commonly known as:  MILK OF MAGNESIA  Take 5 mLs by mouth daily as needed for mild constipation.     oxyCODONE-acetaminophen 5-325 MG per tablet  Commonly known as:  PERCOCET/ROXICET  Take 1-2 tablets by mouth every 4 (four) hours as needed for moderate pain or severe pain.     Vitamin D 2000 UNITS Caps  Take 1 capsule by mouth daily.     WOMENS MULTIVITAMIN PLUS Tabs  Take 1 tablet by mouth daily.        Meds ordered this encounter  Medications  . albuterol (PROVENTIL HFA;VENTOLIN HFA) 108 (90 BASE) MCG/ACT inhaler    Sig: Inhale 2 puffs into the lungs every 6 (six) hours as needed for wheezing or shortness of breath.    Dispense:  3 Inhaler  Refill:  3  . budesonide-formoterol (SYMBICORT) 80-4.5 MCG/ACT inhaler    Sig: Inhale 2 puffs into the lungs 2 (two) times daily.    Dispense:  3 Inhaler    Refill:  3  . hydrochlorothiazide (MICROZIDE) 12.5 MG capsule    Sig: Take 1 capsule (12.5 mg total) by mouth daily.    Dispense:  30 capsule    Refill:  3    Immunization History  Administered Date(s) Administered  . Influenza,inj,Quad PF,36+ Mos 11/03/2013  . Pneumococcal Polysaccharide-23 07/20/1998  . Td 06/19/2002    Family History  Problem Relation Age of Onset  . Diabetes Father   . Arthritis Sister   . Arthritis Daughter   . Arthritis Son   . Arthritis Paternal Aunt     History  Substance Use Topics  . Smoking status: Never Smoker   . Smokeless tobacco: Never Used  . Alcohol Use: Yes     Comment: occassional    Review of Systems   As noted in HPI  Filed  Vitals:   05/19/14 1150  BP: 130/70  Pulse:   Temp:   Resp:     Physical Exam  Physical Exam  Constitutional: No distress.  Eyes: EOM are normal. Pupils are equal, round, and reactive to light.  Cardiovascular: Normal rate.   Pulmonary/Chest: Breath sounds normal. No respiratory distress. She has no wheezes. She has no rales.  Musculoskeletal: She exhibits no edema.    CBC    Component Value Date/Time   WBC 9.6 02/09/2014 0524   RBC 3.25* 02/09/2014 0524   HGB 9.9* 02/09/2014 0524   HCT 30.1* 02/09/2014 0524   PLT 261 02/09/2014 0524   MCV 92.6 02/09/2014 0524   LYMPHSABS 2.0 11/03/2013 1725   MONOABS 0.5 11/03/2013 1725   EOSABS 0.1 11/03/2013 1725   BASOSABS 0.0 11/03/2013 1725    CMP     Component Value Date/Time   NA 138 02/14/2014 1542   K 4.0 02/14/2014 1542   CL 102 02/14/2014 1542   CO2 29 02/14/2014 1542   GLUCOSE 117* 02/14/2014 1542   BUN 5* 02/14/2014 1542   CREATININE 0.80 02/14/2014 1542   CREATININE 0.75 02/01/2014 1140   CALCIUM 9.5 02/14/2014 1542   PROT 6.6 02/14/2014 1542   ALBUMIN 3.9 02/14/2014 1542   AST 19 02/14/2014 1542   ALT 17 02/14/2014 1542   ALKPHOS 45 02/14/2014 1542   BILITOT 0.2 02/14/2014 1542   GFRNONAA 87 02/14/2014 1542   GFRNONAA >90 02/01/2014 1140   GFRAA >89 02/14/2014 1542   GFRAA >90 02/01/2014 1140    Lab Results  Component Value Date/Time   CHOL 195 05/30/2009 07:59 PM    No components found for: HGA1C  Lab Results  Component Value Date/Time   AST 19 02/14/2014 03:42 PM    Assessment and Plan  Asthma, mild persistent, uncomplicated - Plan: symptoms are stable continue with albuterol (PROVENTIL HFA;VENTOLIN HFA) 108 (90 BASE) MCG/ACT inhaler, budesonide-formoterol (SYMBICORT) 80-4.5 MCG/ACT inhaler  Essential hypertension, benign - Plan:continue with DASH diet, a hydrochlorothiazide (MICROZIDE) 12.5 MG capsule, COMPLETE METABOLIC PANEL WITH GFR  IFG (impaired fasting glucose) - Plan: advised patient  for low carbohydrate diet will check COMPLETE METABOLIC PANEL WITH GFR, Hemoglobin A1c  History of anemia - Plan: will repeat CBC with Differential/Platelet, patient is taking iron pills 2 times a day.   Health Maintenance  -Vaccinations:  Pneumovax given today.  Return in about 3 months (around 08/18/2014), or if symptoms worsen or fail to  improve, for hypertension.   This note has been created with Surveyor, quantity. Any transcriptional errors are unintentional.    Lorayne Marek, MD

## 2014-05-19 NOTE — Patient Instructions (Signed)
DASH Eating Plan °DASH stands for "Dietary Approaches to Stop Hypertension." The DASH eating plan is a healthy eating plan that has been shown to reduce high blood pressure (hypertension). Additional health benefits may include reducing the risk of type 2 diabetes mellitus, heart disease, and stroke. The DASH eating plan may also help with weight loss. °WHAT DO I NEED TO KNOW ABOUT THE DASH EATING PLAN? °For the DASH eating plan, you will follow these general guidelines: °· Choose foods with a percent daily value for sodium of less than 5% (as listed on the food label). °· Use salt-free seasonings or herbs instead of table salt or sea salt. °· Check with your health care provider or pharmacist before using salt substitutes. °· Eat lower-sodium products, often labeled as "lower sodium" or "no salt added." °· Eat fresh foods. °· Eat more vegetables, fruits, and low-fat dairy products. °· Choose whole grains. Look for the word "whole" as the first word in the ingredient list. °· Choose fish and skinless chicken or turkey more often than red meat. Limit fish, poultry, and meat to 6 oz (170 g) each day. °· Limit sweets, desserts, sugars, and sugary drinks. °· Choose heart-healthy fats. °· Limit cheese to 1 oz (28 g) per day. °· Eat more home-cooked food and less restaurant, buffet, and fast food. °· Limit fried foods. °· Cook foods using methods other than frying. °· Limit canned vegetables. If you do use them, rinse them well to decrease the sodium. °· When eating at a restaurant, ask that your food be prepared with less salt, or no salt if possible. °WHAT FOODS CAN I EAT? °Seek help from a dietitian for individual calorie needs. °Grains °Whole grain or whole wheat bread. Brown rice. Whole grain or whole wheat pasta. Quinoa, bulgur, and whole grain cereals. Low-sodium cereals. Corn or whole wheat flour tortillas. Whole grain cornbread. Whole grain crackers. Low-sodium crackers. °Vegetables °Fresh or frozen vegetables  (raw, steamed, roasted, or grilled). Low-sodium or reduced-sodium tomato and vegetable juices. Low-sodium or reduced-sodium tomato sauce and paste. Low-sodium or reduced-sodium canned vegetables.  °Fruits °All fresh, canned (in natural juice), or frozen fruits. °Meat and Other Protein Products °Ground beef (85% or leaner), grass-fed beef, or beef trimmed of fat. Skinless chicken or turkey. Ground chicken or turkey. Pork trimmed of fat. All fish and seafood. Eggs. Dried beans, peas, or lentils. Unsalted nuts and seeds. Unsalted canned beans. °Dairy °Low-fat dairy products, such as skim or 1% milk, 2% or reduced-fat cheeses, low-fat ricotta or cottage cheese, or plain low-fat yogurt. Low-sodium or reduced-sodium cheeses. °Fats and Oils °Tub margarines without trans fats. Light or reduced-fat mayonnaise and salad dressings (reduced sodium). Avocado. Safflower, olive, or canola oils. Natural peanut or almond butter. °Other °Unsalted popcorn and pretzels. °The items listed above may not be a complete list of recommended foods or beverages. Contact your dietitian for more options. °WHAT FOODS ARE NOT RECOMMENDED? °Grains °White bread. White pasta. White rice. Refined cornbread. Bagels and croissants. Crackers that contain trans fat. °Vegetables °Creamed or fried vegetables. Vegetables in a cheese sauce. Regular canned vegetables. Regular canned tomato sauce and paste. Regular tomato and vegetable juices. °Fruits °Dried fruits. Canned fruit in light or heavy syrup. Fruit juice. °Meat and Other Protein Products °Fatty cuts of meat. Ribs, chicken wings, bacon, sausage, bologna, salami, chitterlings, fatback, hot dogs, bratwurst, and packaged luncheon meats. Salted nuts and seeds. Canned beans with salt. °Dairy °Whole or 2% milk, cream, half-and-half, and cream cheese. Whole-fat or sweetened yogurt. Full-fat   cheeses or blue cheese. Nondairy creamers and whipped toppings. Processed cheese, cheese spreads, or cheese  curds. °Condiments °Onion and garlic salt, seasoned salt, table salt, and sea salt. Canned and packaged gravies. Worcestershire sauce. Tartar sauce. Barbecue sauce. Teriyaki sauce. Soy sauce, including reduced sodium. Steak sauce. Fish sauce. Oyster sauce. Cocktail sauce. Horseradish. Ketchup and mustard. Meat flavorings and tenderizers. Bouillon cubes. Hot sauce. Tabasco sauce. Marinades. Taco seasonings. Relishes. °Fats and Oils °Butter, stick margarine, lard, shortening, ghee, and bacon fat. Coconut, palm kernel, or palm oils. Regular salad dressings. °Other °Pickles and olives. Salted popcorn and pretzels. °The items listed above may not be a complete list of foods and beverages to avoid. Contact your dietitian for more information. °WHERE CAN I FIND MORE INFORMATION? °National Heart, Lung, and Blood Institute: www.nhlbi.nih.gov/health/health-topics/topics/dash/ °Document Released: 01/24/2011 Document Revised: 06/21/2013 Document Reviewed: 12/09/2012 °Exit[employer]® Patient Information ©2015 [employer]. This information is not intended to replace advice given to you by your health care provider. Make sure you discuss any questions you have with your health care provider. ° °

## 2014-05-23 ENCOUNTER — Ambulatory Visit: Payer: Self-pay

## 2014-05-27 ENCOUNTER — Telehealth: Payer: Self-pay

## 2014-05-27 NOTE — Telephone Encounter (Signed)
-----   Message from Lorayne Marek, MD sent at 05/20/2014 10:55 AM EDT ----- Blood work reviewed noticed hemoglobin A1c of 5.9 %, patient has prediabetes, call and advise patient for low carbohydrate diet.

## 2014-05-27 NOTE — Telephone Encounter (Signed)
Patient is aware of her lab results 

## 2014-06-10 ENCOUNTER — Ambulatory Visit: Payer: Self-pay | Attending: Internal Medicine

## 2014-06-13 ENCOUNTER — Ambulatory Visit
Admission: RE | Admit: 2014-06-13 | Discharge: 2014-06-13 | Disposition: A | Discharge status: Home / Self Care | Payer: No Typology Code available for payment source | Source: Ambulatory Visit

## 2014-06-13 DIAGNOSIS — Z1231 Encounter for screening mammogram for malignant neoplasm of breast: Secondary | ICD-10-CM

## 2014-08-01 ENCOUNTER — Ambulatory Visit: Payer: No Typology Code available for payment source | Admitting: Internal Medicine

## 2014-08-23 ENCOUNTER — Encounter: Payer: Self-pay | Admitting: Internal Medicine

## 2014-08-23 ENCOUNTER — Ambulatory Visit: Payer: No Typology Code available for payment source | Attending: Internal Medicine | Admitting: Internal Medicine

## 2014-08-23 VITALS — BP 128/88 | HR 90 | Temp 98.0°F | Resp 16 | Wt 220.0 lb

## 2014-08-23 DIAGNOSIS — R7301 Impaired fasting glucose: Secondary | ICD-10-CM | POA: Insufficient documentation

## 2014-08-23 DIAGNOSIS — I10 Essential (primary) hypertension: Secondary | ICD-10-CM

## 2014-08-23 DIAGNOSIS — J4530 Mild persistent asthma, uncomplicated: Secondary | ICD-10-CM | POA: Insufficient documentation

## 2014-08-23 DIAGNOSIS — E785 Hyperlipidemia, unspecified: Secondary | ICD-10-CM | POA: Insufficient documentation

## 2014-08-23 DIAGNOSIS — G4730 Sleep apnea, unspecified: Secondary | ICD-10-CM | POA: Insufficient documentation

## 2014-08-23 DIAGNOSIS — Z79899 Other long term (current) drug therapy: Secondary | ICD-10-CM | POA: Insufficient documentation

## 2014-08-23 LAB — COMPLETE METABOLIC PANEL WITH GFR
ALT: 27 U/L (ref 0–35)
AST: 23 U/L (ref 0–37)
Albumin: 4 g/dL (ref 3.5–5.2)
Alkaline Phosphatase: 52 U/L (ref 39–117)
BUN: 13 mg/dL (ref 6–23)
CHLORIDE: 101 meq/L (ref 96–112)
CO2: 29 meq/L (ref 19–32)
Calcium: 9.3 mg/dL (ref 8.4–10.5)
Creat: 0.77 mg/dL (ref 0.50–1.10)
GFR, Est African American: >89 mL/min
Glucose, Bld: 94 mg/dL (ref 70–99)
POTASSIUM: 4.1 meq/L (ref 3.5–5.3)
Sodium: 139 mEq/L (ref 135–145)
Total Bilirubin: 0.3 mg/dL (ref 0.2–1.2)
Total Protein: 6.8 g/dL (ref 6.0–8.3)

## 2014-08-23 MED ORDER — ALBUTEROL SULFATE HFA 108 (90 BASE) MCG/ACT IN AERS: 2.0000 | INHALATION_SPRAY | Freq: Four times a day (QID) | RESPIRATORY_TRACT | Status: DC | PRN

## 2014-08-23 MED ORDER — HYDROCHLOROTHIAZIDE 12.5 MG PO CAPS: 12.5000 mg | ORAL_CAPSULE | Freq: Every day | ORAL | Status: DC

## 2014-08-23 NOTE — Patient Instructions (Signed)
DASH Eating Plan DASH stands for "Dietary Approaches to Stop Hypertension." The DASH eating plan is a healthy eating plan that has been shown to reduce high blood pressure (hypertension). Additional health benefits may include reducing the risk of type 2 diabetes mellitus, heart disease, and stroke. The DASH eating plan may also help with weight loss. WHAT DO I NEED TO KNOW ABOUT THE DASH EATING PLAN? For the DASH eating plan, you will follow these general guidelines:  Choose foods with a percent daily value for sodium of less than 5% (as listed on the food label).  Use salt-free seasonings or herbs instead of table salt or sea salt.  Check with your health care provider or pharmacist before using salt substitutes.  Eat lower-sodium products, often labeled as "lower sodium" or "no salt added."  Eat fresh foods.  Eat more vegetables, fruits, and low-fat dairy products.  Choose whole grains. Look for the word "whole" as the first word in the ingredient list.  Choose fish and skinless chicken or turkey more often than red meat. Limit fish, poultry, and meat to 6 oz (170 g) each day.  Limit sweets, desserts, sugars, and sugary drinks.  Choose heart-healthy fats.  Limit cheese to 1 oz (28 g) per day.  Eat more home-cooked food and less restaurant, buffet, and fast food.  Limit fried foods.  Cook foods using methods other than frying.  Limit canned vegetables. If you do use them, rinse them well to decrease the sodium.  When eating at a restaurant, ask that your food be prepared with less salt, or no salt if possible. WHAT FOODS CAN I EAT? Seek help from a dietitian for individual calorie needs. Grains Whole grain or whole wheat bread. Brown rice. Whole grain or whole wheat pasta. Quinoa, bulgur, and whole grain cereals. Low-sodium cereals. Corn or whole wheat flour tortillas. Whole grain cornbread. Whole grain crackers. Low-sodium crackers. Vegetables Fresh or frozen vegetables  (raw, steamed, roasted, or grilled). Low-sodium or reduced-sodium tomato and vegetable juices. Low-sodium or reduced-sodium tomato sauce and paste. Low-sodium or reduced-sodium canned vegetables.  Fruits All fresh, canned (in natural juice), or frozen fruits. Meat and Other Protein Products Ground beef (85% or leaner), grass-fed beef, or beef trimmed of fat. Skinless chicken or turkey. Ground chicken or turkey. Pork trimmed of fat. All fish and seafood. Eggs. Dried beans, peas, or lentils. Unsalted nuts and seeds. Unsalted canned beans. Dairy Low-fat dairy products, such as skim or 1% milk, 2% or reduced-fat cheeses, low-fat ricotta or cottage cheese, or plain low-fat yogurt. Low-sodium or reduced-sodium cheeses. Fats and Oils Tub margarines without trans fats. Light or reduced-fat mayonnaise and salad dressings (reduced sodium). Avocado. Safflower, olive, or canola oils. Natural peanut or almond butter. Other Unsalted popcorn and pretzels. The items listed above may not be a complete list of recommended foods or beverages. Contact your dietitian for more options. WHAT FOODS ARE NOT RECOMMENDED? Grains White bread. White pasta. White rice. Refined cornbread. Bagels and croissants. Crackers that contain trans fat. Vegetables Creamed or fried vegetables. Vegetables in a cheese sauce. Regular canned vegetables. Regular canned tomato sauce and paste. Regular tomato and vegetable juices. Fruits Dried fruits. Canned fruit in light or heavy syrup. Fruit juice. Meat and Other Protein Products Fatty cuts of meat. Ribs, chicken wings, bacon, sausage, bologna, salami, chitterlings, fatback, hot dogs, bratwurst, and packaged luncheon meats. Salted nuts and seeds. Canned beans with salt. Dairy Whole or 2% milk, cream, half-and-half, and cream cheese. Whole-fat or sweetened yogurt. Full-fat   cheeses or blue cheese. Nondairy creamers and whipped toppings. Processed cheese, cheese spreads, or cheese  curds. Condiments Onion and garlic salt, seasoned salt, table salt, and sea salt. Canned and packaged gravies. Worcestershire sauce. Tartar sauce. Barbecue sauce. Teriyaki sauce. Soy sauce, including reduced sodium. Steak sauce. Fish sauce. Oyster sauce. Cocktail sauce. Horseradish. Ketchup and mustard. Meat flavorings and tenderizers. Bouillon cubes. Hot sauce. Tabasco sauce. Marinades. Taco seasonings. Relishes. Fats and Oils Butter, stick margarine, lard, shortening, ghee, and bacon fat. Coconut, palm kernel, or palm oils. Regular salad dressings. Other Pickles and olives. Salted popcorn and pretzels. The items listed above may not be a complete list of foods and beverages to avoid. Contact your dietitian for more information. WHERE CAN I FIND MORE INFORMATION? National Heart, Lung, and [employer] Institute: www.nhlbi.nih.gov/health/health-topics/topics/dash/ Document Released: 01/24/2011 Document Revised: 06/21/2013 Document Reviewed: 12/09/2012 ExitCare Patient Information 2015 [employer]. This information is not intended to replace advice given to you by your health care provider. Make sure you discuss any questions you have with your health care provider. Diabetes Mellitus and Food It is important for you to manage your blood sugar (glucose) level. Your blood glucose level can be greatly affected by what you eat. Eating healthier foods in the appropriate amounts throughout the day at about the same time each day will help you control your blood glucose level. It can also help slow or prevent worsening of your diabetes mellitus. Healthy eating may even help you improve the level of your blood pressure and reach or maintain a healthy weight.  HOW CAN FOOD AFFECT ME? Carbohydrates Carbohydrates affect your blood glucose level more than any other type of food. Your dietitian will help you determine how many carbohydrates to eat at each meal and teach you how to count carbohydrates. Counting  carbohydrates is important to keep your blood glucose at a healthy level, especially if you are using insulin or taking certain medicines for diabetes mellitus. Alcohol Alcohol can cause sudden decreases in blood glucose (hypoglycemia), especially if you use insulin or take certain medicines for diabetes mellitus. Hypoglycemia can be a life-threatening condition. Symptoms of hypoglycemia (sleepiness, dizziness, and disorientation) are similar to symptoms of having too much alcohol.  If your health care provider has given you approval to drink alcohol, do so in moderation and use the following guidelines:  Women should not have more than one drink per day, and men should not have more than two drinks per day. One drink is equal to:  12 oz of beer.  5 oz of wine.  1 oz of hard liquor.  Do not drink on an empty stomach.  Keep yourself hydrated. Have water, diet soda, or unsweetened iced tea.  Regular soda, juice, and other mixers might contain a lot of carbohydrates and should be counted. WHAT FOODS ARE NOT RECOMMENDED? As you make food choices, it is important to remember that all foods are not the same. Some foods have fewer nutrients per serving than other foods, even though they might have the same number of calories or carbohydrates. It is difficult to get your body what it needs when you eat foods with fewer nutrients. Examples of foods that you should avoid that are high in calories and carbohydrates but low in nutrients include:  Trans fats (most processed foods list trans fats on the Nutrition Facts label).  Regular soda.  Juice.  Candy.  Sweets, such as cake, pie, doughnuts, and cookies.  Fried foods. WHAT FOODS CAN I EAT? Have nutrient-rich foods,   which will nourish your body and keep you healthy. The food you should eat also will depend on several factors, including:  The calories you need.  The medicines you take.  Your weight.  Your blood glucose level.  Your  blood pressure level.  Your cholesterol level. You also should eat a variety of foods, including:  Protein, such as meat, poultry, fish, tofu, nuts, and seeds (lean animal proteins are best).  Fruits.  Vegetables.  Dairy products, such as milk, cheese, and yogurt (low fat is best).  Breads, grains, pasta, cereal, rice, and beans.  Fats such as olive oil, trans fat-free margarine, canola oil, avocado, and olives. DOES EVERYONE WITH DIABETES MELLITUS HAVE THE SAME MEAL PLAN? Because every person with diabetes mellitus is different, there is not one meal plan that works for everyone. It is very important that you meet with a dietitian who will help you create a meal plan that is just right for you. Document Released: 11/01/2004 Document Revised: 02/09/2013 Document Reviewed: 01/01/2013 ExitCare Patient Information 2015 [employer]. This information is not intended to replace advice given to you by your health care provider. Make sure you discuss any questions you have with your health care provider.  

## 2014-08-23 NOTE — Progress Notes (Signed)
Patient here for follow up on her asthma and HTN Patient also requesting refills on her medications

## 2014-08-23 NOTE — Progress Notes (Signed)
MRN: [id number] Name: Regina Crawford  Sex: female Age: 49 y.o. DOB: March 28, 1965  Allergies: Review of patient's allergies indicates no known allergies.  Chief Complaint  Patient presents with  . Asthma    HPI: Patient is 49 y.o. female who history of asthma, hypertension comes today for followup, as per patient she is compliant in taking her medications and needs refills denies any fever chills nausea vomiting denies any chest pain shortness of breath. Patient denies smoking cigarettes.previous blood work reviewed with the patient noticed impaired fasting glucose.  Past Medical History  Diagnosis Date  . Asthma   . Hypertension   . Anemia   . SVD (spontaneous vaginal delivery)     x 2 ( one in [place] and one at [facility])  . Hyperlipidemia     diet controlled - no med  . Sleep apnea     does not use CPAP every night    Past Surgical History  Procedure Laterality Date  . Wisdom tooth extraction    . Abdominal hysterectomy N/A 02/08/2014    Procedure: HYSTERECTOMY ABDOMINAL;  Surgeon: Mora Bellman, MD;  Location: [facility];  Service: Gynecology;  Laterality: N/A;  . Bilateral salpingectomy Bilateral 02/08/2014    Procedure: BILATERAL SALPINGECTOMY;  Surgeon: Mora Bellman, MD;  Location: [facility];  Service: Gynecology;  Laterality: Bilateral;      Medication List       This list is accurate as of: 08/23/14 10:48 AM.  Always use your most recent med list.               albuterol 108 (90 BASE) MCG/ACT inhaler  Commonly known as:  PROVENTIL HFA;VENTOLIN HFA  Inhale 2 puffs into the lungs every 6 (six) hours as needed for wheezing or shortness of breath.     budesonide-formoterol 80-4.5 MCG/ACT inhaler  Commonly known as:  SYMBICORT  Inhale 2 puffs into the lungs 2 (two) times daily.     docusate sodium 100 MG capsule  Commonly known as:  COLACE  Take 1 capsule (100 mg total) by mouth 2 (two) times daily as needed.     ferrous sulfate 325 (65 FE) MG EC tablet  Take 1  tablet (325 mg total) by mouth 2 (two) times daily with a meal.     hydrochlorothiazide 12.5 MG capsule  Commonly known as:  MICROZIDE  Take 1 capsule (12.5 mg total) by mouth daily.     ibuprofen 600 MG tablet  Commonly known as:  ADVIL,MOTRIN  Take 1 tablet (600 mg total) by mouth every 6 (six) hours as needed for fever or headache.     magnesium hydroxide 400 MG/5ML suspension  Commonly known as:  MILK OF MAGNESIA  Take 5 mLs by mouth daily as needed for mild constipation.     oxyCODONE-acetaminophen 5-325 MG per tablet  Commonly known as:  PERCOCET/ROXICET  Take 1-2 tablets by mouth every 4 (four) hours as needed for moderate pain or severe pain.     Vitamin D 2000 UNITS Caps  Take 1 capsule by mouth daily.     WOMENS MULTIVITAMIN PLUS Tabs  Take 1 tablet by mouth daily.        Meds ordered this encounter  Medications  . albuterol (PROVENTIL HFA;VENTOLIN HFA) 108 (90 BASE) MCG/ACT inhaler    Sig: Inhale 2 puffs into the lungs every 6 (six) hours as needed for wheezing or shortness of breath.    Dispense:  3 Inhaler    Refill:  3  .  hydrochlorothiazide (MICROZIDE) 12.5 MG capsule    Sig: Take 1 capsule (12.5 mg total) by mouth daily.    Dispense:  30 capsule    Refill:  3    Immunization History  Administered Date(s) Administered  . Influenza,inj,Quad PF,36+ Mos 11/03/2013  . Pneumococcal Polysaccharide-23 07/20/1998, 05/19/2014  . Td 06/19/2002    Family History  Problem Relation Age of Onset  . Diabetes Father   . Arthritis Sister   . Arthritis Daughter   . Arthritis Son   . Arthritis Paternal Aunt     History  Substance Use Topics  . Smoking status: Never Smoker   . Smokeless tobacco: Never Used  . Alcohol Use: Yes     Comment: occassional    Review of Systems   As noted in HPI  Filed Vitals:   08/23/14 0937  BP: 128/88  Pulse: 90  Temp: 98 F (36.7 C)  Resp: 16    Physical Exam  Physical Exam  Constitutional: No distress.  Eyes:  EOM are normal. Pupils are equal, round, and reactive to light.  Cardiovascular: Normal rate and regular rhythm.   Pulmonary/Chest: Breath sounds normal. No respiratory distress. She has no wheezes. She has no rales.  Musculoskeletal: She exhibits no edema.    CBC    Component Value Date/Time   WBC 3.7* 05/19/2014 1157   RBC 3.96 05/19/2014 1157   HGB 12.0 05/19/2014 1157   HCT 36.1 05/19/2014 1157   PLT 331 05/19/2014 1157   MCV 91.2 05/19/2014 1157   LYMPHSABS 1.5 05/19/2014 1157   MONOABS 0.3 05/19/2014 1157   EOSABS 0.2 05/19/2014 1157   BASOSABS 0.0 05/19/2014 1157    CMP     Component Value Date/Time   NA 138 05/19/2014 1157   K 3.9 05/19/2014 1157   CL 99 05/19/2014 1157   CO2 29 05/19/2014 1157   GLUCOSE 84 05/19/2014 1157   BUN 11 05/19/2014 1157   CREATININE 0.72 05/19/2014 1157   CREATININE 0.75 02/01/2014 1140   CALCIUM 9.3 05/19/2014 1157   PROT 7.1 05/19/2014 1157   ALBUMIN 4.2 05/19/2014 1157   AST 22 05/19/2014 1157   ALT 21 05/19/2014 1157   ALKPHOS 49 05/19/2014 1157   BILITOT 0.2 05/19/2014 1157   GFRNONAA >89 05/19/2014 1157   GFRNONAA >90 02/01/2014 1140   GFRAA >89 05/19/2014 1157   GFRAA >90 02/01/2014 1140    Lab Results  Component Value Date/Time   CHOL 195 05/30/2009 07:59 PM    Lab Results  Component Value Date/Time   HGBA1C 5.9* 05/19/2014 11:57 AM    Lab Results  Component Value Date/Time   AST 22 05/19/2014 11:57 AM    Assessment and Plan  Asthma, mild persistent, uncomplicated - Plan: patient is on Symbicort and albuterol when necessary albuterol (PROVENTIL HFA;VENTOLIN HFA) 108 (90 BASE) MCG/ACT inhaler  Essential hypertension, benign - Plan: advise patient to continue with DASH diet, continue with hydrochlorothiazide (MICROZIDE) 12.5 MG capsule, will check blood chemistry  IFG (impaired fasting glucose) - Plan:  Advised patient for low carbohydrate diet, repeat blood chemistry COMPLETE METABOLIC PANEL WITH  GFR    Return in about 3 months (around 11/23/2014), or if symptoms worsen or fail to improve.   This note has been created with Surveyor, quantity. Any transcriptional errors are unintentional.    Lorayne Marek, MD

## 2014-08-25 ENCOUNTER — Telehealth: Payer: Self-pay

## 2014-08-25 NOTE — Telephone Encounter (Signed)
Patient is aware of her lab results 

## 2014-08-25 NOTE — Telephone Encounter (Signed)
-----   Message from Lorayne Marek, MD sent at 08/24/2014 12:56 PM EDT ----- Call and let the Patient know that blood work is normal.

## 2014-12-01 ENCOUNTER — Ambulatory Visit: Payer: No Typology Code available for payment source | Attending: Internal Medicine | Admitting: Internal Medicine

## 2014-12-09 ENCOUNTER — Ambulatory Visit: Payer: No Typology Code available for payment source | Attending: Internal Medicine | Admitting: Internal Medicine

## 2014-12-09 ENCOUNTER — Encounter: Payer: Self-pay | Admitting: Internal Medicine

## 2014-12-09 VITALS — BP 138/83 | HR 92 | Temp 98.0°F | Resp 16 | Ht 64.0 in | Wt 216.8 lb

## 2014-12-09 DIAGNOSIS — J4530 Mild persistent asthma, uncomplicated: Secondary | ICD-10-CM

## 2014-12-09 DIAGNOSIS — I10 Essential (primary) hypertension: Secondary | ICD-10-CM

## 2014-12-09 DIAGNOSIS — Z0000 Encounter for general adult medical examination without abnormal findings: Secondary | ICD-10-CM

## 2014-12-09 MED ORDER — HYDROCHLOROTHIAZIDE 12.5 MG PO CAPS: 12.5000 mg | ORAL_CAPSULE | Freq: Every day | ORAL | Status: DC

## 2014-12-09 MED ORDER — ALBUTEROL SULFATE HFA 108 (90 BASE) MCG/ACT IN AERS: 2.0000 | INHALATION_SPRAY | Freq: Four times a day (QID) | RESPIRATORY_TRACT | Status: DC | PRN

## 2014-12-09 MED ORDER — FLUTICASONE PROPIONATE 50 MCG/ACT NA SUSP: 2.0000 | Freq: Every day | NASAL | Status: DC

## 2014-12-09 MED ORDER — BUDESONIDE-FORMOTEROL FUMARATE 80-4.5 MCG/ACT IN AERO: 2.0000 | INHALATION_SPRAY | Freq: Two times a day (BID) | RESPIRATORY_TRACT | Status: DC

## 2014-12-09 NOTE — Progress Notes (Signed)
Patient ID: Regina Crawford, female   DOB: 08-Apr-1965, 49 y.o.   MRN: [id number] Subjective:  Regina Crawford is a 49 y.o. female with hypertension and asthma. Patient reports that she has been taking her medication daily without complications. She would like medication refills because she ran out of pills 4 days ago. She reports that since the seasons changes she has been having more SOB and allergy symptoms. She would like a refill of her Flonase spray.   Current Outpatient Prescriptions  Medication Sig Dispense Refill  . albuterol (PROVENTIL HFA;VENTOLIN HFA) 108 (90 BASE) MCG/ACT inhaler Inhale 2 puffs into the lungs every 6 (six) hours as needed for wheezing or shortness of breath. 3 Inhaler 3  . budesonide-formoterol (SYMBICORT) 80-4.5 MCG/ACT inhaler Inhale 2 puffs into the lungs 2 (two) times daily. 3 Inhaler 3  . hydrochlorothiazide (MICROZIDE) 12.5 MG capsule Take 1 capsule (12.5 mg total) by mouth daily. 30 capsule 3  . Cholecalciferol (VITAMIN D) 2000 UNITS CAPS Take 1 capsule by mouth daily.    Marland Kitchen docusate sodium (COLACE) 100 MG capsule Take 1 capsule (100 mg total) by mouth 2 (two) times daily as needed. 30 capsule 2  . ferrous sulfate 325 (65 FE) MG EC tablet Take 1 tablet (325 mg total) by mouth 2 (two) times daily with a meal. 60 tablet 4  . ibuprofen (ADVIL,MOTRIN) 600 MG tablet Take 1 tablet (600 mg total) by mouth every 6 (six) hours as needed for fever or headache. 30 tablet 3  . magnesium hydroxide (MILK OF MAGNESIA) 400 MG/5ML suspension Take 5 mLs by mouth daily as needed for mild constipation. 360 mL 0  . Multiple Vitamins-Minerals (WOMENS MULTIVITAMIN PLUS) TABS Take 1 tablet by mouth daily.      Marland Kitchen oxyCODONE-acetaminophen (PERCOCET/ROXICET) 5-325 MG per tablet Take 1-2 tablets by mouth every 4 (four) hours as needed for moderate pain or severe pain. 30 tablet 0   No current facility-administered medications for this visit.    Hypertension ROS: taking medications as  instructed, no medication side effects noted, no TIA's, no chest pain on exertion, no dyspnea on exertion, no swelling of ankles, no orthostatic dizziness or lightheadedness and no palpitations.   Objective:  BP 138/83 mmHg  Pulse 92  Temp(Src) 98 F (36.7 C)  Resp 16  Ht 5\' 4"  (1.626 m)  Wt 216 lb 12.8 oz (98.34 kg)  BMI 37.20 kg/m2  SpO2 100%  LMP 12/17/2013  Appearance alert, well appearing, and in no distress, oriented to person, place, and time and overweight. General exam BP noted to be well controlled today in office, S1, S2 normal, no gallop, no murmur, chest clear, no JVD, no HSM, no edema.  Lab review: bmet is up to date but she has not had a lipid drawn since 2011. Will have her come back fasting soon for levels.  Regina Crawford was seen today for follow-up.  Diagnoses and all orders for this visit:  Essential hypertension, benign -    Refill hydrochlorothiazide (MICROZIDE) 12.5 MG capsule; Take 1 capsule (12.5 mg total) by mouth daily. -     Lipid panel; Future Patient blood pressure is stable and may continue on current medication.  Education on diet, exercise, and modifiable risk factors discussed. Will obtain appropriate labs as needed. Will follow up in 3-6 months.   Asthma, mild persistent, uncomplicated -     albuterol (PROVENTIL HFA;VENTOLIN HFA) 108 (90 BASE) MCG/ACT inhaler; Inhale 2 puffs into the lungs every 6 (six) hours as needed  for wheezing or shortness of breath. -     budesonide-formoterol (SYMBICORT) 80-4.5 MCG/ACT inhaler; Inhale 2 puffs into the lungs 2 (two) times daily. -     fluticasone (FLONASE) 50 MCG/ACT nasal spray; Place 2 sprays into both nostrils daily.  Health care maintenance -     Flu Vaccine QUAD 36+ mos PF IM (Fluarix & Fluzone Quad PF)   Return in about 6 months (around 06/09/2015) for Hypertension and 1 week fasting Lab visit .   Lance Bosch, NP 12/11/2014 8:29 PM

## 2014-12-09 NOTE — Patient Instructions (Signed)
DASH Eating Plan  DASH stands for "Dietary Approaches to Stop Hypertension." The DASH eating plan is a healthy eating plan that has been shown to reduce high blood pressure (hypertension). Additional health benefits may include reducing the risk of type 2 diabetes mellitus, heart disease, and stroke. The DASH eating plan may also help with weight loss.  WHAT DO I NEED TO KNOW ABOUT THE DASH EATING PLAN?  For the DASH eating plan, you will follow these general guidelines:  · Choose foods with a percent daily value for sodium of less than 5% (as listed on the food label).  · Use salt-free seasonings or herbs instead of table salt or sea salt.  · Check with your health care provider or pharmacist before using salt substitutes.  · Eat lower-sodium products, often labeled as "lower sodium" or "no salt added."  · Eat fresh foods.  · Eat more vegetables, fruits, and low-fat dairy products.  · Choose whole grains. Look for the word "whole" as the first word in the ingredient list.  · Choose fish and skinless chicken or turkey more often than red meat. Limit fish, poultry, and meat to 6 oz (170 g) each day.  · Limit sweets, desserts, sugars, and sugary drinks.  · Choose heart-healthy fats.  · Limit cheese to 1 oz (28 g) per day.  · Eat more home-cooked food and less restaurant, buffet, and fast food.  · Limit fried foods.  · Cook foods using methods other than frying.  · Limit canned vegetables. If you do use them, rinse them well to decrease the sodium.  · When eating at a restaurant, ask that your food be prepared with less salt, or no salt if possible.  WHAT FOODS CAN I EAT?  Seek help from a dietitian for individual calorie needs.  Grains  Whole grain or whole wheat bread. Brown rice. Whole grain or whole wheat pasta. Quinoa, bulgur, and whole grain cereals. Low-sodium cereals. Corn or whole wheat flour tortillas. Whole grain cornbread. Whole grain crackers. Low-sodium crackers.  Vegetables  Fresh or frozen vegetables  (raw, steamed, roasted, or grilled). Low-sodium or reduced-sodium tomato and vegetable juices. Low-sodium or reduced-sodium tomato sauce and paste. Low-sodium or reduced-sodium canned vegetables.   Fruits  All fresh, canned (in natural juice), or frozen fruits.  Meat and Other Protein Products  Ground beef (85% or leaner), grass-fed beef, or beef trimmed of fat. Skinless chicken or turkey. Ground chicken or turkey. Pork trimmed of fat. All fish and seafood. Eggs. Dried beans, peas, or lentils. Unsalted nuts and seeds. Unsalted canned beans.  Dairy  Low-fat dairy products, such as skim or 1% milk, 2% or reduced-fat cheeses, low-fat ricotta or cottage cheese, or plain low-fat yogurt. Low-sodium or reduced-sodium cheeses.  Fats and Oils  Tub margarines without trans fats. Light or reduced-fat mayonnaise and salad dressings (reduced sodium). Avocado. Safflower, olive, or canola oils. Natural peanut or almond butter.  Other  Unsalted popcorn and pretzels.  The items listed above may not be a complete list of recommended foods or beverages. Contact your dietitian for more options.  WHAT FOODS ARE NOT RECOMMENDED?  Grains  White bread. White pasta. White rice. Refined cornbread. Bagels and croissants. Crackers that contain trans fat.  Vegetables  Creamed or fried vegetables. Vegetables in a cheese sauce. Regular canned vegetables. Regular canned tomato sauce and paste. Regular tomato and vegetable juices.  Fruits  Dried fruits. Canned fruit in light or heavy syrup. Fruit juice.  Meat and Other Protein   Products  Fatty cuts of meat. Ribs, chicken wings, bacon, sausage, bologna, salami, chitterlings, fatback, hot dogs, bratwurst, and packaged luncheon meats. Salted nuts and seeds. Canned beans with salt.  Dairy  Whole or 2% milk, cream, half-and-half, and cream cheese. Whole-fat or sweetened yogurt. Full-fat cheeses or blue cheese. Nondairy creamers and whipped toppings. Processed cheese, cheese spreads, or cheese  curds.  Condiments  Onion and garlic salt, seasoned salt, table salt, and sea salt. Canned and packaged gravies. Worcestershire sauce. Tartar sauce. Barbecue sauce. Teriyaki sauce. Soy sauce, including reduced sodium. Steak sauce. Fish sauce. Oyster sauce. Cocktail sauce. Horseradish. Ketchup and mustard. Meat flavorings and tenderizers. Bouillon cubes. Hot sauce. Tabasco sauce. Marinades. Taco seasonings. Relishes.  Fats and Oils  Butter, stick margarine, lard, shortening, ghee, and bacon fat. Coconut, palm kernel, or palm oils. Regular salad dressings.  Other  Pickles and olives. Salted popcorn and pretzels.  The items listed above may not be a complete list of foods and beverages to avoid. Contact your dietitian for more information.  WHERE CAN I FIND MORE INFORMATION?  National Heart, Lung, and [employer] Institute: www.nhlbi.nih.gov/health/health-topics/topics/dash/     This information is not intended to replace advice given to you by your health care provider. Make sure you discuss any questions you have with your health care provider.     Document Released: 01/24/2011 Document Revised: 02/25/2014 Document Reviewed: 12/09/2012  Elsevier Interactive Patient Education ©2016 Elsevier Inc.

## 2014-12-09 NOTE — Progress Notes (Signed)
Patient here for follow up on her asthma and HTN and to meet her New provider Patient will need refills on her inhalers and HCTZ

## 2014-12-19 ENCOUNTER — Ambulatory Visit: Payer: No Typology Code available for payment source | Attending: Family Medicine

## 2014-12-19 DIAGNOSIS — I10 Essential (primary) hypertension: Secondary | ICD-10-CM

## 2014-12-19 LAB — LIPID PANEL
Cholesterol: 224 mg/dL — ABNORMAL HIGH (ref 125–200)
HDL: 50 mg/dL (ref >=46)
LDL Cholesterol: 157 mg/dL — ABNORMAL HIGH (ref <130)
Total CHOL/HDL Ratio: 4.5 Ratio (ref <=5.0)
Triglycerides: 83 mg/dL (ref <150)
VLDL: 17 mg/dL (ref <30)

## 2014-12-23 ENCOUNTER — Other Ambulatory Visit: Payer: Self-pay | Admitting: Internal Medicine

## 2014-12-23 ENCOUNTER — Telehealth: Payer: Self-pay

## 2014-12-23 DIAGNOSIS — E785 Hyperlipidemia, unspecified: Secondary | ICD-10-CM | POA: Insufficient documentation

## 2014-12-23 MED ORDER — ATORVASTATIN CALCIUM 20 MG PO TABS: 20.0000 mg | ORAL_TABLET | Freq: Every day | ORAL | Status: DC

## 2014-12-23 NOTE — Telephone Encounter (Signed)
Spoke with patient this am and she is aware of her lab results and  To pick up up her cholesterol medication at the pharmacy here

## 2015-02-08 ENCOUNTER — Other Ambulatory Visit: Payer: Self-pay | Admitting: Internal Medicine

## 2015-02-08 DIAGNOSIS — J4530 Mild persistent asthma, uncomplicated: Secondary | ICD-10-CM

## 2015-02-08 MED ORDER — ALBUTEROL SULFATE HFA 108 (90 BASE) MCG/ACT IN AERS: 2.0000 | INHALATION_SPRAY | Freq: Four times a day (QID) | RESPIRATORY_TRACT | Status: DC | PRN

## 2015-02-24 ENCOUNTER — Other Ambulatory Visit: Payer: Self-pay

## 2015-02-24 DIAGNOSIS — J4530 Mild persistent asthma, uncomplicated: Secondary | ICD-10-CM

## 2015-02-24 MED ORDER — ALBUTEROL SULFATE HFA 108 (90 BASE) MCG/ACT IN AERS: 2.0000 | INHALATION_SPRAY | Freq: Four times a day (QID) | RESPIRATORY_TRACT | Status: DC | PRN

## 2015-12-11 ENCOUNTER — Other Ambulatory Visit: Payer: Self-pay | Admitting: Internal Medicine

## 2015-12-11 DIAGNOSIS — J4530 Mild persistent asthma, uncomplicated: Secondary | ICD-10-CM

## 2016-01-22 ENCOUNTER — Other Ambulatory Visit: Payer: Self-pay | Admitting: *Deleted

## 2016-01-22 DIAGNOSIS — J4530 Mild persistent asthma, uncomplicated: Secondary | ICD-10-CM

## 2016-01-22 MED ORDER — BUDESONIDE-FORMOTEROL FUMARATE 80-4.5 MCG/ACT IN AERO: INHALATION_SPRAY | RESPIRATORY_TRACT | 3 refills | Status: DC

## 2016-01-22 NOTE — Telephone Encounter (Signed)
PRINTED FOR PASS PROGRAM 

## 2016-02-01 ENCOUNTER — Ambulatory Visit: Payer: No Typology Code available for payment source | Attending: Internal Medicine

## 2018-05-25 ENCOUNTER — Ambulatory Visit: Payer: No Typology Code available for payment source | Admitting: Family Medicine

## 2018-06-24 ENCOUNTER — Encounter: Payer: Self-pay | Admitting: Family Medicine

## 2018-06-24 ENCOUNTER — Other Ambulatory Visit: Payer: Self-pay

## 2018-06-24 ENCOUNTER — Ambulatory Visit: Payer: No Typology Code available for payment source | Attending: Family Medicine | Admitting: Family Medicine

## 2018-06-24 DIAGNOSIS — E785 Hyperlipidemia, unspecified: Secondary | ICD-10-CM

## 2018-06-24 DIAGNOSIS — Z862 Personal history of diseases of the blood and blood-forming organs and certain disorders involving the immune mechanism: Secondary | ICD-10-CM

## 2018-06-24 DIAGNOSIS — Z79899 Other long term (current) drug therapy: Secondary | ICD-10-CM

## 2018-06-24 DIAGNOSIS — J4520 Mild intermittent asthma, uncomplicated: Secondary | ICD-10-CM

## 2018-06-24 DIAGNOSIS — I10 Essential (primary) hypertension: Secondary | ICD-10-CM

## 2018-06-24 DIAGNOSIS — R7303 Prediabetes: Secondary | ICD-10-CM

## 2018-06-24 MED ORDER — HYDROCHLOROTHIAZIDE 12.5 MG PO CAPS: 12.5000 mg | ORAL_CAPSULE | Freq: Every day | ORAL | 1 refills | Status: DC

## 2018-06-24 MED ORDER — ALBUTEROL SULFATE HFA 108 (90 BASE) MCG/ACT IN AERS: 2.0000 | INHALATION_SPRAY | Freq: Four times a day (QID) | RESPIRATORY_TRACT | 3 refills | Status: AC | PRN

## 2018-06-24 MED ORDER — BUDESONIDE-FORMOTEROL FUMARATE 80-4.5 MCG/ACT IN AERO: INHALATION_SPRAY | RESPIRATORY_TRACT | 3 refills | Status: AC

## 2018-06-24 NOTE — Progress Notes (Signed)
Patient is being seen today to est care. Per pt she is feeling fine.

## 2018-06-24 NOTE — Progress Notes (Signed)
Virtual Visit via Telephone Note  I connected with Regina Crawford on 06/24/18 at  8:30 AM EDT by telephone and verified that I am speaking with the correct person using two identifiers.   I discussed the limitations, risks, security and privacy concerns of performing an evaluation and management service by telephone and the availability of in person appointments. I also discussed with the patient that there may be a patient responsible charge related to this service. The patient expressed understanding and agreed to proceed.  Patient Location: Home Provider Location: Office Others participating in call: Emilio Aspen, RMA   History of Present Illness:      53 yo female with mild intermittent asthma, essential hypertension, hyperlipidemia, prediabetes with hemoglobin A1c of 5.9 in March 2016 and history of anemia who wishes to reestablish care.  She was last seen in the office on 12/09/2014.  Patient reports that she needs refills of all of her medications.  She denies that she has been seen at any other office in the interim, denies any recent medical follow-up.  She feels that her asthma has been well controlled and she denies any nighttime awakening secondary to shortness of breath, wheezing, cough or chest tightness.  She does not avoid any activities due to fear of asthma exacerbation.  She feels that her asthma is triggered by heavy smoke/chemicals. Patient would like to have refills of albuterol and Symbicort.       She feels that she has not had any issues with her blood pressure.  She denies any headaches or dizziness related to blood pressure.  She would like to have refill of hydrochlorothiazide.  She has had a hysterectomy and does not believe that she is currently anemic.  She has noticed no blood in the stool and no melena.  She denies any increased thirst, no urinary frequency and no blurred vision related to possible prediabetes or diabetes.  She was on atorvastatin in the past but  no longer wishes to take this medication.  She has not had any follow-up of her cholesterol.    Past Medical History:  Diagnosis Date  . Anemia   . Asthma   . Hyperlipidemia    diet controlled - no med  . Hypertension   . Sleep apnea    does not use CPAP every night  . SVD (spontaneous vaginal delivery)    x 2 ( one in [place] and one at [facility])    Past Surgical History:  Procedure Laterality Date  . ABDOMINAL HYSTERECTOMY N/A 02/08/2014   Procedure: HYSTERECTOMY ABDOMINAL;  Surgeon: Mora Bellman, MD;  Location: [facility];  Service: Gynecology;  Laterality: N/A;  . BILATERAL SALPINGECTOMY Bilateral 02/08/2014   Procedure: BILATERAL SALPINGECTOMY;  Surgeon: Mora Bellman, MD;  Location: [facility];  Service: Gynecology;  Laterality: Bilateral;  . WISDOM TOOTH EXTRACTION      Family History  Problem Relation Age of Onset  . Diabetes Father   . Arthritis Sister   . Arthritis Daughter   . Arthritis Son   . Arthritis Paternal Aunt     Social History   Tobacco Use  . Smoking status: Never Smoker  . Smokeless tobacco: Never Used  Substance Use Topics  . Alcohol use: Yes    Comment: occassional  . Drug use: No     No Known Allergies   Current Outpatient Medications on File Prior to Visit  Medication Sig Dispense Refill  . albuterol (PROVENTIL HFA;VENTOLIN HFA) 108 (90 Base) MCG/ACT inhaler Inhale 2 puffs  into the lungs every 6 (six) hours as needed for wheezing or shortness of breath. 54 g 3  . budesonide-formoterol (SYMBICORT) 80-4.5 MCG/ACT inhaler INHALE 2 PUFFS INTO THE LUNGS 2 TIMES DAILY. 3 Inhaler 3  . Cholecalciferol (VITAMIN D) 2000 UNITS CAPS Take 1 capsule by mouth daily.    . ferrous sulfate 325 (65 FE) MG EC tablet Take 1 tablet (325 mg total) by mouth 2 (two) times daily with a meal. 60 tablet 4  . fluticasone (FLONASE) 50 MCG/ACT nasal spray Place 2 sprays into both nostrils daily. 16 g 6  . hydrochlorothiazide (MICROZIDE) 12.5 MG capsule Take 1 capsule (12.5 mg  total) by mouth daily. 30 capsule 3  . magnesium hydroxide (MILK OF MAGNESIA) 400 MG/5ML suspension Take 5 mLs by mouth daily as needed for mild constipation. 360 mL 0  . Multiple Vitamins-Minerals (WOMENS MULTIVITAMIN PLUS) TABS Take 1 tablet by mouth daily.      Marland Kitchen oxyCODONE-acetaminophen (PERCOCET/ROXICET) 5-325 MG per tablet Take 1-2 tablets by mouth every 4 (four) hours as needed for moderate pain or severe pain. 30 tablet 0  . atorvastatin (LIPITOR) 20 MG tablet Take 1 tablet (20 mg total) by mouth daily. (Patient not taking: Reported on 06/24/2018) 90 tablet 3  . docusate sodium (COLACE) 100 MG capsule Take 1 capsule (100 mg total) by mouth 2 (two) times daily as needed. (Patient not taking: Reported on 06/24/2018) 30 capsule 2  . ibuprofen (ADVIL,MOTRIN) 600 MG tablet Take 1 tablet (600 mg total) by mouth every 6 (six) hours as needed for fever or headache. (Patient not taking: Reported on 06/24/2018) 30 tablet 3   No current facility-administered medications on file prior to visit.    Review of Systems  Constitutional: Negative for chills, fever and malaise/fatigue.  HENT: Negative for congestion and sore throat.   Eyes: Negative for blurred vision and double vision.  Respiratory: Negative for cough, shortness of breath and wheezing.   Cardiovascular: Negative for chest pain and palpitations.  Gastrointestinal: Negative for abdominal pain, blood in stool, constipation, diarrhea, heartburn, melena, nausea and vomiting.  Genitourinary: Negative for dysuria and frequency.  Skin: Negative for itching and rash.  Neurological: Negative for dizziness and headaches.  Endo/Heme/Allergies: Negative for polydipsia. Does not bruise/bleed easily.    Observations/Objective: No vital signs or physical exam conducted as visit was done via telephone due to the current limitations and restrictions on in-office visits due to the current COVID-19 pandemic  Assessment and Plan: 1. Mild intermittent asthma  without complication Patient feels that her asthma has been well controlled.  Patient request refills of albuterol inhaler to use as needed as well as Symbicort for daily use and prescriptions were sent to patient's pharmacy - Comprehensive metabolic panel; Future - CBC with Differential; Future  2. Essential hypertension Refill provided of hydrochlorothiazide and patient has been asked to return to clinic for nurse visit blood pressure check along with blood work including CMP and lipid panel in follow-up of hypertension.  She has also been asked to follow a Dash type diet and engage in regular low impact cardiovascular exercise for heart health. - Comprehensive metabolic panel; Future - Lipid panel; Future  3. Hyperlipidemia, unspecified hyperlipidemia type Patient was previously on atorvastatin but states that she discontinued the medication and does not wish to restart.  Patient will have CMP and lipid panel and patient will be advised based on the results of the need to resume any medication if she continues to have hyperlipidemia.  Low-fat diet  and regular exercise encouraged. - Comprehensive metabolic panel; Future - Lipid panel; Future  4. Prediabetes Patient with prior prediabetes with hemoglobin A1c of 5.9 in 2016.  Patient denies any current symptoms of prediabetes/diabetes and patient will have hemoglobin A1c at upcoming lab visit.  Low carbohydrate diet and regular exercise encouraged - Hemoglobin A1c; Future  5. Encounter for long-term current use of medication Patient will return to clinic for labs in follow-up of use of medication once she has restarted the medications that have been sent to her pharmacy today. - Comprehensive metabolic panel; Future - CBC with Differential; Future  6. History of iron deficiency anemia Patient will have CBC in follow-up of her history of iron deficiency and patient has also been asked to schedule for future well exam to discuss preventative  health measures such as GI referral for colonoscopy - CBC with Differential; Future  Follow Up Instructions:Return in about 3 months (around 09/24/2018).    I discussed the assessment and treatment plan with the patient. The patient was provided an opportunity to ask questions and all were answered. The patient agreed with the plan and demonstrated an understanding of the instructions.   The patient was advised to call back or seek an in-person evaluation if the symptoms worsen or if the condition fails to improve as anticipated.  I provided 14  minutes of non-face-to-face time during this encounter.   Antony Blackbird, MD

## 2018-06-25 ENCOUNTER — Encounter: Payer: Self-pay | Admitting: Family Medicine

## 2018-11-05 ENCOUNTER — Ambulatory Visit (HOSPITAL_COMMUNITY)
Admission: RE | Admit: 2018-11-05 | Discharge: 2018-11-05 | Disposition: A | Discharge status: Home / Self Care | Payer: BLUE CROSS/BLUE SHIELD | Source: Ambulatory Visit | Attending: Physician Assistant | Admitting: Physician Assistant

## 2018-11-05 ENCOUNTER — Other Ambulatory Visit: Payer: Self-pay

## 2018-11-05 ENCOUNTER — Ambulatory Visit: Payer: BLUE CROSS/BLUE SHIELD | Attending: Family Medicine | Admitting: Physician Assistant

## 2018-11-05 VITALS — BP 180/114 | HR 83 | Temp 98.8°F | Ht 64.0 in | Wt 221.6 lb

## 2018-11-05 DIAGNOSIS — I10 Essential (primary) hypertension: Secondary | ICD-10-CM | POA: Diagnosis not present

## 2018-11-05 DIAGNOSIS — R7303 Prediabetes: Secondary | ICD-10-CM

## 2018-11-05 DIAGNOSIS — M25561 Pain in right knee: Secondary | ICD-10-CM | POA: Diagnosis present

## 2018-11-05 DIAGNOSIS — G8929 Other chronic pain: Secondary | ICD-10-CM

## 2018-11-05 DIAGNOSIS — Z23 Encounter for immunization: Secondary | ICD-10-CM

## 2018-11-05 DIAGNOSIS — E785 Hyperlipidemia, unspecified: Secondary | ICD-10-CM | POA: Diagnosis not present

## 2018-11-05 MED ORDER — AMLODIPINE BESYLATE 10 MG PO TABS: 10.0000 mg | ORAL_TABLET | Freq: Every day | ORAL | 3 refills | Status: AC

## 2018-11-05 MED ORDER — MELOXICAM 15 MG PO TABS: 15.0000 mg | ORAL_TABLET | Freq: Every day | ORAL | 0 refills | Status: AC

## 2018-11-05 MED FILL — AMLODIPINE BESYLATE 10 MG T: 10 | 30 days supply | Qty: 30 | Fill #0

## 2018-11-05 MED FILL — MELOXICAM 15 MG TABLET: 15 | 30 days supply | Qty: 30 | Fill #0

## 2018-11-05 NOTE — Progress Notes (Signed)
Patient ID: Regina Crawford, female   DOB: [birth year], 53 y.o.   MRN: [id number]      Sherah Romney, is a 53 y.o. female  [id number]  [id number]  DOB - Jul 13, 1965  Subjective:  Chief Complaint and HPI: Regina Crawford is a 53 y.o. female here today  For R knee pain for about  3 months. NKI.  Pain is worse in the morning or when getting up and moving around.  No swelling.  No redness.  No fever.  Not taking anything by mouth for the pain but has tried ben gay with minimal relief.    Patient has been prescribed 12.5 HCTZ but has not been taking it bc she says she doesn't have time.  She denies HA/CP/SOB/dizziness.    No labs in a while.    ROS:   Constitutional:  No f/c, No night sweats, No unexplained weight loss. EENT:  No vision changes, No blurry vision, No hearing changes. No mouth, throat, or ear problems.  Respiratory: No cough, No SOB Cardiac: No CP, no palpitations GI:  No abd pain, No N/V/D. GU: No Urinary s/sx Musculoskeletal: +R knee pain Neuro: No headache, no dizziness, no motor weakness.  Skin: No rash Endocrine:  No polydipsia. No polyuria.  Psych: Denies SI/HI  No problems updated.  ALLERGIES: No Known Allergies  PAST MEDICAL HISTORY: Past Medical History:  Diagnosis Date  . Anemia   . Asthma   . Hyperlipidemia    diet controlled - no med  . Hypertension   . Sleep apnea    does not use CPAP every night  . SVD (spontaneous vaginal delivery)    x 2 ( one in [place] and one at [facility])    MEDICATIONS AT HOME: Prior to Admission medications   Medication Sig Start Date End Date Taking? Authorizing Provider  albuterol (VENTOLIN HFA) 108 (90 Base) MCG/ACT inhaler Inhale 2 puffs into the lungs every 6 (six) hours as needed for wheezing or shortness of breath. 06/24/18  Yes Fulp, Cammie, MD  budesonide-formoterol (SYMBICORT) 80-4.5 MCG/ACT inhaler INHALE 2 PUFFS INTO THE LUNGS 2 TIMES DAILY. 06/24/18  Yes Fulp, Cammie, MD  amLODipine (NORVASC) 10 MG tablet  Take 1 tablet (10 mg total) by mouth daily. 11/05/18   Argentina Donovan, PA-C  meloxicam (MOBIC) 15 MG tablet Take 1 tablet (15 mg total) by mouth daily. Prn pain 11/05/18   Argentina Donovan, PA-C     Objective:  EXAM:   Vitals:   11/05/18 1406 11/05/18 1410  BP: (!) 195/130 (!) 180/114  Pulse: 83   Temp: 98.8 F (37.1 C)   TempSrc: Oral   SpO2: 100%   Weight: 221 lb 9.6 oz (100.5 kg)   Height: 5\' 4"  (1.626 m)     General appearance : A&OX3. NAD. Non-toxic-appearing HEENT: Atraumatic and Normocephalic.  PERRLA. EOM intact.  Chest/Lungs:  Breathing-non-labored, Good air entry bilaterally, breath sounds normal without rales, rhonchi, or wheezing  CVS: S1 S2 regular, no murmurs, gallops, rubs  R knee with mild crepitus on extension.  No ballotment or effusion or erythema.  Ligaments are stable.   Extremities: Bilateral Lower Ext shows no edema, both legs are warm to touch with = pulse throughout Neurology:  CN II-XII grossly intact, Non focal.   Psych:  TP linear. J/I WNL. Normal speech. Appropriate eye contact and affect.  Skin:  No Rash  Data Review Lab Results  Component Value Date   HGBA1C 5.9 (H) 05/19/2014     Assessment &  Plan   1. Need for immunization against influenza Patient initially said she wanted it, then refused  2. Chronic pain of right knee - DG Knee Complete 4 Views Right; Future - Vitamin D, 25-hydroxy - meloxicam (MOBIC) 15 MG tablet; Take 1 tablet (15 mg total) by mouth daily. Prn pain  Dispense: 30 tablet; Refill: 0  3. Essential hypertension We have discussed target BP range and blood pressure goal. I have advised patient to check BP regularly and to call us back or report to clinic if the numbers are consistently higher than 140/90. We discussed the importance of compliance with medical therapy and DASH diet recommended, consequences of uncontrolled hypertension discussed.  Warnings of uncontrolled BP discussed at length.  Take meds and check BP  3 times weekly and record.  Bring BP readings to next visit.   - Comprehensive metabolic panel - CBC with Differential/Platelet - amLODipine (NORVASC) 10 MG tablet; Take 1 tablet (10 mg total) by mouth daily.  Dispense: 90 tablet; Refill: 3  4. Hyperlipidemia, unspecified hyperlipidemia type - Lipid panel  5. Prediabetes - Hemoglobin A1c I have had a lengthy discussion and provided education about insulin resistance and the intake of too much sugar/refined carbohydrates.  I have advised the patient to work at a goal of eliminating sugary drinks, candy, desserts, sweets, refined sugars, processed foods, and white carbohydrates.  The patient expresses understanding.    She has not had labs in a long time and did not want to do them today, but I urged her of the importance.    Patient have been counseled extensively about nutrition and exercise  Return for [facility] for BP check in 3 weeks;  PCP in 3 months.  The patient was given clear instructions to go to ER or return to medical center if symptoms don't improve, worsen or new problems develop. The patient verbalized understanding. The patient was told to call to get lab results if they haven't heard anything in the next week.     Freeman Caldron, PA-C [facility] and [place], [place]   11/05/2018, 2:33 PM

## 2018-11-05 NOTE — Patient Instructions (Signed)
Check blood pressure 3 times weekly and record and take BP meds.     Hypertension, Adult High blood pressure (hypertension) is when the force of blood pumping through the arteries is too strong. The arteries are the blood vessels that carry blood from the heart throughout the body. Hypertension forces the heart to work harder to pump blood and may cause arteries to become narrow or stiff. Untreated or uncontrolled hypertension can cause a heart attack, heart failure, a stroke, kidney disease, and other problems. A blood pressure reading consists of a higher number over a lower number. Ideally, your blood pressure should be below 120/80. The first ("top") number is called the systolic pressure. It is a measure of the pressure in your arteries as your heart beats. The second ("bottom") number is called the diastolic pressure. It is a measure of the pressure in your arteries as the heart relaxes. What are the causes? The exact cause of this condition is not known. There are some conditions that result in or are related to high blood pressure. What increases the risk? Some risk factors for high blood pressure are under your control. The following factors may make you more likely to develop this condition:  Smoking.  Having type 2 diabetes mellitus, high cholesterol, or both.  Not getting enough exercise or physical activity.  Being overweight.  Having too much fat, sugar, calories, or salt (sodium) in your diet.  Drinking too much alcohol. Some risk factors for high blood pressure may be difficult or impossible to change. Some of these factors include:  Having chronic kidney disease.  Having a family history of high blood pressure.  Age. Risk increases with age.  Race. You may be at higher risk if you are African American.  Gender. Men are at higher risk than women before age 61. After age 71, women are at higher risk than men.  Having obstructive sleep apnea.  Stress. What are the  signs or symptoms? High blood pressure may not cause symptoms. Very high blood pressure (hypertensive crisis) may cause:  Headache.  Anxiety.  Shortness of breath.  Nosebleed.  Nausea and vomiting.  Vision changes.  Severe chest pain.  Seizures. How is this diagnosed? This condition is diagnosed by measuring your blood pressure while you are seated, with your arm resting on a flat surface, your legs uncrossed, and your feet flat on the floor. The cuff of the blood pressure monitor will be placed directly against the skin of your upper arm at the level of your heart. It should be measured at least twice using the same arm. Certain conditions can cause a difference in blood pressure between your right and left arms. Certain factors can cause blood pressure readings to be lower or higher than normal for a short period of time:  When your blood pressure is higher when you are in a health care provider's office than when you are at home, this is called white coat hypertension. Most people with this condition do not need medicines.  When your blood pressure is higher at home than when you are in a health care provider's office, this is called masked hypertension. Most people with this condition may need medicines to control blood pressure. If you have a high blood pressure reading during one visit or you have normal blood pressure with other risk factors, you may be asked to:  Return on a different day to have your blood pressure checked again.  Monitor your blood pressure at home for  1 week or longer. If you are diagnosed with hypertension, you may have other blood or imaging tests to help your health care provider understand your overall risk for other conditions. How is this treated? This condition is treated by making healthy lifestyle changes, such as eating healthy foods, exercising more, and reducing your alcohol intake. Your health care provider may prescribe medicine if lifestyle  changes are not enough to get your blood pressure under control, and if:  Your systolic blood pressure is above 130.  Your diastolic blood pressure is above 80. Your personal target blood pressure may vary depending on your medical conditions, your age, and other factors. Follow these instructions at home: Eating and drinking   Eat a diet that is high in fiber and potassium, and low in sodium, added sugar, and fat. An example eating plan is called the DASH (Dietary Approaches to Stop Hypertension) diet. To eat this way: ? Eat plenty of fresh fruits and vegetables. Try to fill one half of your plate at each meal with fruits and vegetables. ? Eat whole grains, such as whole-wheat pasta, brown rice, or whole-grain bread. Fill about one fourth of your plate with whole grains. ? Eat or drink low-fat dairy products, such as skim milk or low-fat yogurt. ? Avoid fatty cuts of meat, processed or cured meats, and poultry with skin. Fill about one fourth of your plate with lean proteins, such as fish, chicken without skin, beans, eggs, or tofu. ? Avoid pre-made and processed foods. These tend to be higher in sodium, added sugar, and fat.  Reduce your daily sodium intake. Most people with hypertension should eat less than 1,500 mg of sodium a day.  Do not drink alcohol if: ? Your health care provider tells you not to drink. ? You are pregnant, may be pregnant, or are planning to become pregnant.  If you drink alcohol: ? Limit how much you use to:  0-1 drink a day for women.  0-2 drinks a day for men. ? Be aware of how much alcohol is in your drink. In the [place] one drink equals one 12 oz bottle of beer (355 mL), one 5 oz glass of wine (148 mL), or one 1 oz glass of hard liquor (44 mL). Lifestyle   Work with your health care provider to maintain a healthy body weight or to lose weight. Ask what an ideal weight is for you.  Get at least 30 minutes of exercise most days of the week. Activities  may include walking, swimming, or biking.  Include exercise to strengthen your muscles (resistance exercise), such as Pilates or lifting weights, as part of your weekly exercise routine. Try to do these types of exercises for 30 minutes at least 3 days a week.  Do not use any products that contain nicotine or tobacco, such as cigarettes, e-cigarettes, and chewing tobacco. If you need help quitting, ask your health care provider.  Monitor your blood pressure at home as told by your health care provider.  Keep all follow-up visits as told by your health care provider. This is important. Medicines  Take over-the-counter and prescription medicines only as told by your health care provider. Follow directions carefully. Blood pressure medicines must be taken as prescribed.  Do not skip doses of blood pressure medicine. Doing this puts you at risk for problems and can make the medicine less effective.  Ask your health care provider about side effects or reactions to medicines that you should watch for. Contact a  health care provider if you:  Think you are having a reaction to a medicine you are taking.  Have headaches that keep coming back (recurring).  Feel dizzy.  Have swelling in your ankles.  Have trouble with your vision. Get help right away if you:  Develop a severe headache or confusion.  Have unusual weakness or numbness.  Feel faint.  Have severe pain in your chest or abdomen.  Vomit repeatedly.  Have trouble breathing. Summary  Hypertension is when the force of blood pumping through your arteries is too strong. If this condition is not controlled, it may put you at risk for serious complications.  Your personal target blood pressure may vary depending on your medical conditions, your age, and other factors. For most people, a normal blood pressure is less than 120/80.  Hypertension is treated with lifestyle changes, medicines, or a combination of both. Lifestyle  changes include losing weight, eating a healthy, low-sodium diet, exercising more, and limiting alcohol. This information is not intended to replace advice given to you by your health care provider. Make sure you discuss any questions you have with your health care provider. Document Released: 02/04/2005 Document Revised: 10/15/2017 Document Reviewed: 10/15/2017 [employer] Patient Education  2020 [employer].

## 2018-11-06 LAB — CBC WITH DIFFERENTIAL/PLATELET
Basophils Absolute: 0 10*3/uL (ref 0.0–0.2)
Basos: 0 %
EOS (ABSOLUTE): 0.2 10*3/uL (ref 0.0–0.4)
Eos: 4 %
Hematocrit: 37 % (ref 34.0–46.6)
Hemoglobin: 11.8 g/dL (ref 11.1–15.9)
Immature Grans (Abs): 0 10*3/uL (ref 0.0–0.1)
Immature Granulocytes: 0 %
Lymphocytes Absolute: 1.8 10*3/uL (ref 0.7–3.1)
Lymphs: 40 %
MCH: 29.6 pg (ref 26.6–33.0)
MCHC: 31.9 g/dL (ref 31.5–35.7)
MCV: 93 fL (ref 79–97)
Monocytes Absolute: 0.4 10*3/uL (ref 0.1–0.9)
Monocytes: 8 %
Neutrophils Absolute: 2.1 10*3/uL (ref 1.4–7.0)
Neutrophils: 48 %
Platelets: 290 10*3/uL (ref 150–450)
RBC: 3.99 x10E6/uL (ref 3.77–5.28)
RDW: 12.6 % (ref 11.7–15.4)
WBC: 4.6 10*3/uL (ref 3.4–10.8)

## 2018-11-06 LAB — HEMOGLOBIN A1C
Est. average glucose Bld gHb Est-mCnc: 123 mg/dL
Hgb A1c MFr Bld: 5.9 % — ABNORMAL HIGH (ref 4.8–5.6)

## 2018-11-06 LAB — VITAMIN D 25 HYDROXY (VIT D DEFICIENCY, FRACTURES): Vit D, 25-Hydroxy: 30.1 ng/mL (ref 30.0–100.0)

## 2018-11-06 LAB — COMPREHENSIVE METABOLIC PANEL
ALT: 22 IU/L (ref 0–32)
AST: 16 IU/L (ref 0–40)
Albumin/Globulin Ratio: 1.6 (ref 1.2–2.2)
Albumin: 4.6 g/dL (ref 3.8–4.9)
Alkaline Phosphatase: 67 IU/L (ref 39–117)
BUN/Creatinine Ratio: 9 (ref 9–23)
BUN: 7 mg/dL (ref 6–24)
Bilirubin Total: 0.2 mg/dL (ref 0.0–1.2)
CO2: 27 mmol/L (ref 20–29)
Calcium: 9.6 mg/dL (ref 8.7–10.2)
Chloride: 100 mmol/L (ref 96–106)
Creatinine, Ser: 0.77 mg/dL (ref 0.57–1.00)
GFR calc Af Amer: 101 mL/min/{1.73_m2} (ref >59)
GFR calc non Af Amer: 88 mL/min/{1.73_m2} (ref >59)
Globulin, Total: 2.9 g/dL (ref 1.5–4.5)
Glucose: 82 mg/dL (ref 65–99)
Potassium: 4 mmol/L (ref 3.5–5.2)
Sodium: 140 mmol/L (ref 134–144)
Total Protein: 7.5 g/dL (ref 6.0–8.5)

## 2018-11-06 LAB — LIPID PANEL
Chol/HDL Ratio: 5.2 ratio — ABNORMAL HIGH (ref 0.0–4.4)
Cholesterol, Total: 261 mg/dL — ABNORMAL HIGH (ref 100–199)
HDL: 50 mg/dL (ref >39)
LDL Chol Calc (NIH): 183 mg/dL — ABNORMAL HIGH (ref 0–99)
Triglycerides: 154 mg/dL — ABNORMAL HIGH (ref 0–149)
VLDL Cholesterol Cal: 28 mg/dL (ref 5–40)

## 2018-11-10 ENCOUNTER — Other Ambulatory Visit: Payer: Self-pay | Admitting: Physician Assistant

## 2018-11-10 DIAGNOSIS — E785 Hyperlipidemia, unspecified: Secondary | ICD-10-CM

## 2018-11-10 MED ORDER — ATORVASTATIN CALCIUM 20 MG PO TABS: 20.0000 mg | ORAL_TABLET | Freq: Every day | ORAL | 3 refills | Status: AC

## 2018-11-10 MED FILL — ATORVASTATIN 20 MG TABLET: 20 | 30 days supply | Qty: 30 | Fill #0

## 2018-12-09 MED FILL — AMLODIPINE BESYLATE 10 MG T: 10 | 30 days supply | Qty: 30 | Fill #1

## 2018-12-09 MED FILL — ATORVASTATIN CALCIUM 20 MG: 20 | 30 days supply | Qty: 30 | Fill #1

## 2019-06-01 ENCOUNTER — Other Ambulatory Visit: Payer: Self-pay | Admitting: Physician Assistant

## 2019-06-01 DIAGNOSIS — G8929 Other chronic pain: Secondary | ICD-10-CM

## 2019-06-01 MED FILL — AMLODIPINE BESYLATE 10 MG T: 10 | 90 days supply | Qty: 90 | Fill #2

## 2019-06-01 MED FILL — ATORVASTATIN CALCIUM 20 MG: 20 | 90 days supply | Qty: 90 | Fill #2

## 2020-09-09 IMAGING — DX DG KNEE COMPLETE 4+V*R*
5 series · 5 of 5 positions shown · non-contrast
Comparison: No prior.

CLINICAL DATA: Knee pain.  No injury.

EXAM:
RIGHT KNEE - COMPLETE 4+ VIEW

[knee ap]
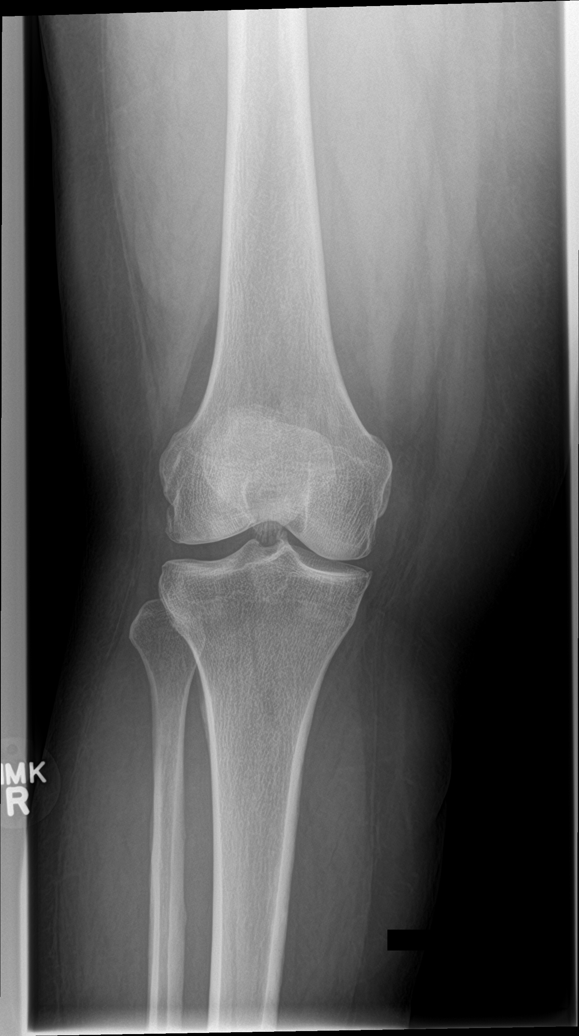

[tunnel]
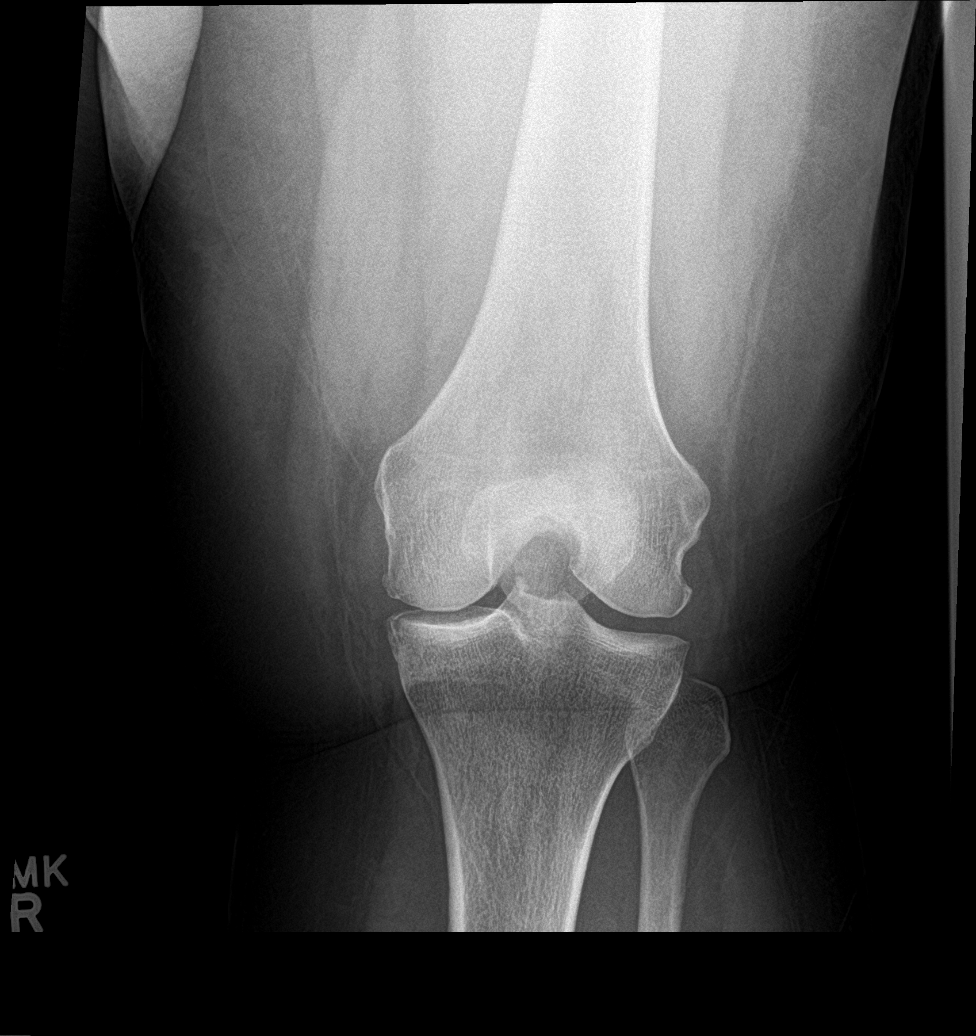

[knee lat]
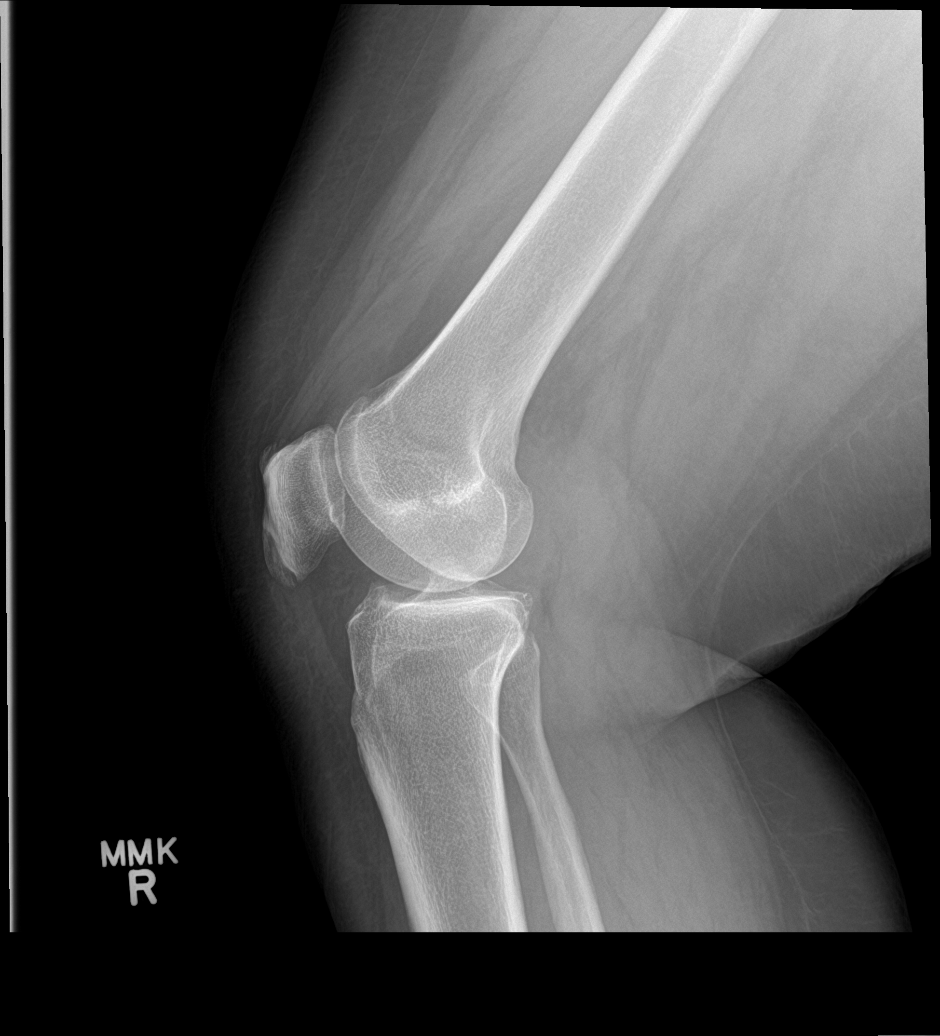

[knee obl (1 of 2)]
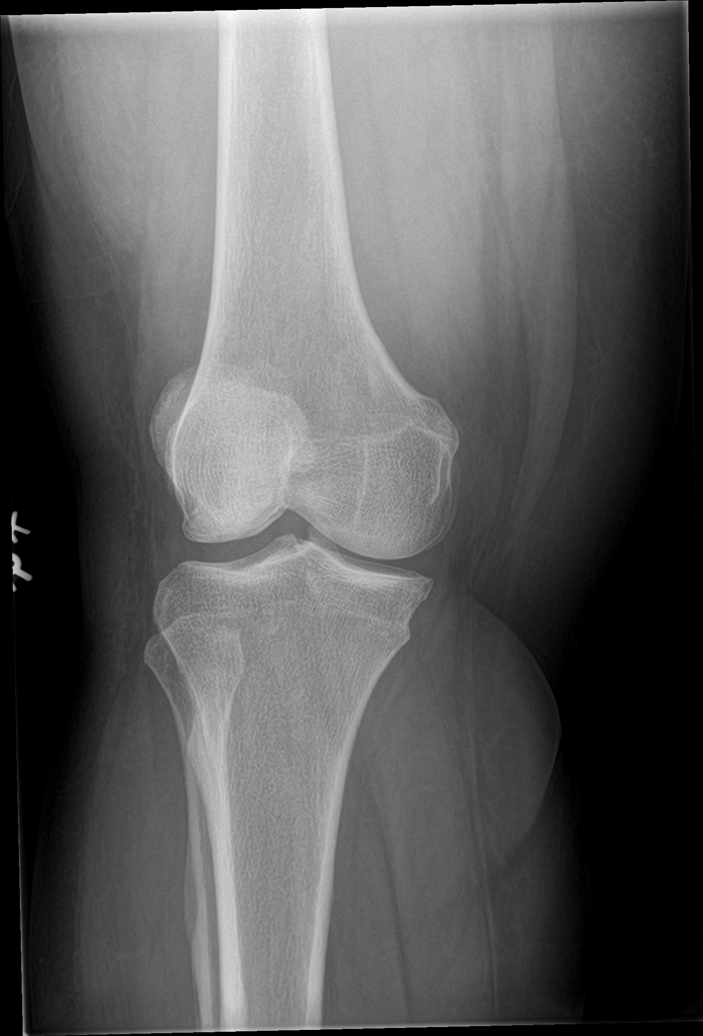

[knee obl (2 of 2)]
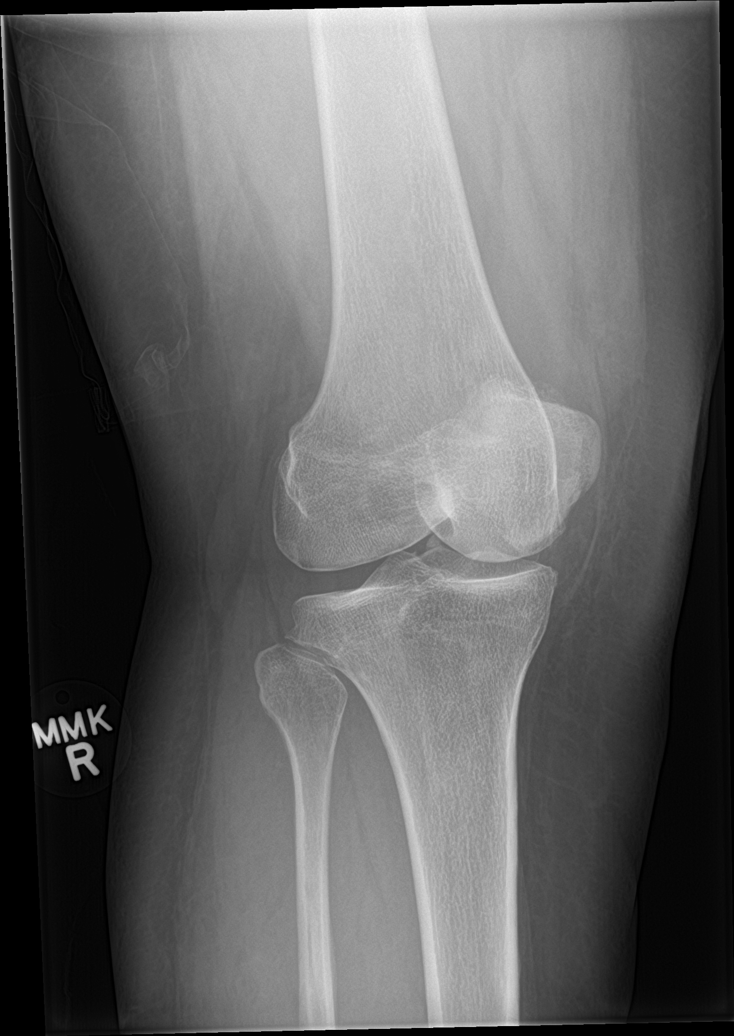

[5 of 5 positions shown; findings below may reference images not displayed]

FINDINGS: Medial compartment degenerative change. No acute bony or joint
abnormality identified. No evidence of fracture. No significant
joint effusion.
IMPRESSION: Mild medial compartment degenerative change. No acute bony
abnormality.
